# Patient Record
Sex: Female | Born: 1955 | Race: White | Hispanic: No | Marital: Single | Smoking: Former smoker
Health system: Southern US, Community
[De-identification: ages and names within clinical notes are randomized; demographics above are authoritative.]

## PROBLEM LIST (undated history)

## (undated) DIAGNOSIS — G894 Chronic pain syndrome: Secondary | ICD-10-CM

## (undated) DIAGNOSIS — M1990 Unspecified osteoarthritis, unspecified site: Secondary | ICD-10-CM

## (undated) DIAGNOSIS — E876 Hypokalemia: Secondary | ICD-10-CM

## (undated) DIAGNOSIS — I10 Essential (primary) hypertension: Secondary | ICD-10-CM

## (undated) DIAGNOSIS — N3289 Other specified disorders of bladder: Secondary | ICD-10-CM

## (undated) DIAGNOSIS — E039 Hypothyroidism, unspecified: Secondary | ICD-10-CM

## (undated) DIAGNOSIS — I214 Non-ST elevation (NSTEMI) myocardial infarction: Secondary | ICD-10-CM

## (undated) DIAGNOSIS — K529 Noninfective gastroenteritis and colitis, unspecified: Secondary | ICD-10-CM

## (undated) DIAGNOSIS — K589 Irritable bowel syndrome without diarrhea: Secondary | ICD-10-CM

## (undated) DIAGNOSIS — I472 Ventricular tachycardia: Secondary | ICD-10-CM

---

## 2001-03-02 ENCOUNTER — Encounter (HOSPITAL_COMMUNITY): Admission: RE | Admit: 2001-03-02 | Discharge: 2001-04-01 | Payer: Self-pay | Admitting: Rheumatology

## 2001-04-27 ENCOUNTER — Encounter (HOSPITAL_COMMUNITY): Admission: RE | Admit: 2001-04-27 | Discharge: 2001-05-27 | Payer: Self-pay | Admitting: Rheumatology

## 2001-07-20 ENCOUNTER — Encounter (HOSPITAL_COMMUNITY): Admission: RE | Admit: 2001-07-20 | Discharge: 2001-08-19 | Payer: Self-pay | Admitting: Rheumatology

## 2001-09-07 ENCOUNTER — Encounter (HOSPITAL_COMMUNITY): Admission: RE | Admit: 2001-09-07 | Discharge: 2001-10-07 | Payer: Self-pay | Admitting: Rheumatology

## 2001-11-30 ENCOUNTER — Encounter (HOSPITAL_COMMUNITY): Admission: RE | Admit: 2001-11-30 | Discharge: 2001-12-30 | Payer: Self-pay | Admitting: Rheumatology

## 2002-03-01 ENCOUNTER — Encounter (HOSPITAL_COMMUNITY): Admission: RE | Admit: 2002-03-01 | Discharge: 2002-03-31 | Payer: Self-pay | Admitting: Rheumatology

## 2002-06-21 ENCOUNTER — Encounter (HOSPITAL_COMMUNITY): Admission: RE | Admit: 2002-06-21 | Discharge: 2002-07-21 | Payer: Self-pay | Admitting: Rheumatology

## 2002-11-22 ENCOUNTER — Encounter (HOSPITAL_COMMUNITY): Admission: RE | Admit: 2002-11-22 | Discharge: 2002-12-22 | Payer: Self-pay | Admitting: Rheumatology

## 2003-05-23 ENCOUNTER — Encounter (HOSPITAL_COMMUNITY): Admission: RE | Admit: 2003-05-23 | Discharge: 2003-06-06 | Payer: Self-pay | Admitting: Rheumatology

## 2005-06-07 ENCOUNTER — Ambulatory Visit: Payer: Self-pay | Admitting: Internal Medicine

## 2005-07-05 ENCOUNTER — Ambulatory Visit: Payer: Self-pay | Admitting: Internal Medicine

## 2005-07-06 ENCOUNTER — Ambulatory Visit (HOSPITAL_COMMUNITY): Admission: RE | Admit: 2005-07-06 | Discharge: 2005-07-06 | Payer: Self-pay | Admitting: Internal Medicine

## 2006-01-13 ENCOUNTER — Ambulatory Visit: Payer: Self-pay | Admitting: Internal Medicine

## 2012-10-07 ENCOUNTER — Other Ambulatory Visit: Payer: Self-pay | Admitting: Endocrinology

## 2012-10-21 ENCOUNTER — Emergency Department (HOSPITAL_COMMUNITY)
Admission: EM | Admit: 2012-10-21 | Discharge: 2012-10-21 | Disposition: A | Discharge status: Home / Self Care | Payer: Self-pay | Attending: Emergency Medicine | Admitting: Emergency Medicine

## 2012-10-21 ENCOUNTER — Encounter (HOSPITAL_COMMUNITY): Payer: Self-pay | Admitting: *Deleted

## 2012-10-21 DIAGNOSIS — R0602 Shortness of breath: Secondary | ICD-10-CM | POA: Insufficient documentation

## 2012-10-21 DIAGNOSIS — Z538 Procedure and treatment not carried out for other reasons: Secondary | ICD-10-CM | POA: Insufficient documentation

## 2012-10-21 DIAGNOSIS — Z09 Encounter for follow-up examination after completed treatment for conditions other than malignant neoplasm: Secondary | ICD-10-CM | POA: Insufficient documentation

## 2012-10-21 DIAGNOSIS — Z87891 Personal history of nicotine dependence: Secondary | ICD-10-CM | POA: Insufficient documentation

## 2012-10-21 HISTORY — DX: Unspecified osteoarthritis, unspecified site: M19.90

## 2012-10-21 HISTORY — DX: Essential (primary) hypertension: I10

## 2012-10-21 NOTE — ED Notes (Signed)
Spoke with Dr. Leslie Dales about injection that pt should receive. Informed that pt was suppose to receive at Short Stay, informed pt to call Short Stay and set up schedule to receive injections per MD.

## 2012-10-21 NOTE — ED Notes (Addendum)
Pt reports being sent here by dr altheimer for a thyroid injection. We have paged him for further orders. pts only complaint is mild sob. spo2 90% at triage, resp unlabored and no distress noted.

## 2012-10-21 NOTE — ED Notes (Signed)
After speaking with md, pt is go to to short stay on Monday for thyroid meds, pt informed and decided to leave ER despite spo2 90%.

## 2012-10-24 ENCOUNTER — Encounter (HOSPITAL_COMMUNITY)
Admission: RE | Admit: 2012-10-24 | Discharge: 2012-10-24 | Disposition: A | Discharge status: Home / Self Care | Payer: Medicare Other | Source: Ambulatory Visit | Attending: Endocrinology | Admitting: Endocrinology

## 2012-10-24 DIAGNOSIS — E063 Autoimmune thyroiditis: Secondary | ICD-10-CM | POA: Insufficient documentation

## 2012-10-24 DIAGNOSIS — E039 Hypothyroidism, unspecified: Secondary | ICD-10-CM | POA: Insufficient documentation

## 2012-10-24 MED ORDER — LEVOTHYROXINE SODIUM 100 MCG IV SOLR
500.0000 ug | INTRAVENOUS | Status: DC
  Administered 2012-10-24: 500 ug via INTRAVENOUS
  Filled 2012-10-24: qty 25

## 2012-10-24 MED ORDER — SODIUM CHLORIDE 0.9 % IV SOLN
Freq: Once | INTRAVENOUS | Status: AC
  Administered 2012-10-24: 11:00:00 via INTRAVENOUS

## 2012-10-24 NOTE — Progress Notes (Signed)
[name], RPh to call Dr.Altheimer regarding appropriate dose of Levothyroxine. Patient's brother voices concerns about patient behavior recently. States that he is going to go across the street to speak with Dr. Leslie Dales.

## 2012-10-26 ENCOUNTER — Other Ambulatory Visit (HOSPITAL_COMMUNITY): Payer: Self-pay | Admitting: *Deleted

## 2012-10-27 ENCOUNTER — Encounter (HOSPITAL_COMMUNITY)
Admission: RE | Admit: 2012-10-27 | Discharge: 2012-10-27 | Disposition: A | Discharge status: Home / Self Care | Payer: Medicare Other | Source: Ambulatory Visit | Attending: Endocrinology | Admitting: Endocrinology

## 2012-10-27 MED ORDER — SODIUM CHLORIDE 0.9 % IV SOLN: Freq: Once | INTRAVENOUS | Status: DC

## 2012-10-27 MED ORDER — LEVOTHYROXINE SODIUM 100 MCG IV SOLR
500.0000 ug | INTRAVENOUS | Status: DC
  Administered 2012-10-27: 500 ug via INTRAVENOUS
  Filled 2012-10-27: qty 25

## 2012-10-31 ENCOUNTER — Encounter (HOSPITAL_COMMUNITY): Payer: Self-pay

## 2012-11-03 ENCOUNTER — Encounter (HOSPITAL_COMMUNITY)
Admission: RE | Admit: 2012-11-03 | Discharge: 2012-11-03 | Disposition: A | Discharge status: Home / Self Care | Payer: Medicare Other | Source: Ambulatory Visit | Attending: Endocrinology | Admitting: Endocrinology

## 2012-11-03 MED ORDER — SODIUM CHLORIDE 0.9 % IV SOLN
Freq: Once | INTRAVENOUS | Status: AC
  Administered 2012-11-03: 250 mL via INTRAVENOUS

## 2012-11-03 MED ORDER — LEVOTHYROXINE SODIUM 100 MCG IV SOLR
500.0000 ug | INTRAVENOUS | Status: DC
  Administered 2012-11-03: 500 ug via INTRAVENOUS
  Filled 2012-11-03: qty 25

## 2012-11-07 ENCOUNTER — Encounter (HOSPITAL_COMMUNITY): Payer: Self-pay

## 2012-11-14 ENCOUNTER — Emergency Department (HOSPITAL_COMMUNITY): Payer: Medicare Other

## 2012-11-14 ENCOUNTER — Inpatient Hospital Stay (HOSPITAL_COMMUNITY)
Admission: EM | Admit: 2012-11-14 | Discharge: 2012-11-19 | DRG: 640 | Disposition: A | Discharge status: Home / Self Care | Payer: Medicare Other | Attending: Internal Medicine | Admitting: Internal Medicine

## 2012-11-14 ENCOUNTER — Encounter (HOSPITAL_COMMUNITY): Payer: Self-pay | Admitting: *Deleted

## 2012-11-14 DIAGNOSIS — E46 Unspecified protein-calorie malnutrition: Secondary | ICD-10-CM | POA: Diagnosis present

## 2012-11-14 DIAGNOSIS — R3129 Other microscopic hematuria: Secondary | ICD-10-CM | POA: Diagnosis present

## 2012-11-14 DIAGNOSIS — R001 Bradycardia, unspecified: Secondary | ICD-10-CM | POA: Diagnosis not present

## 2012-11-14 DIAGNOSIS — I4729 Other ventricular tachycardia: Secondary | ICD-10-CM | POA: Diagnosis present

## 2012-11-14 DIAGNOSIS — R739 Hyperglycemia, unspecified: Secondary | ICD-10-CM | POA: Diagnosis present

## 2012-11-14 DIAGNOSIS — E876 Hypokalemia: Principal | ICD-10-CM | POA: Diagnosis present

## 2012-11-14 DIAGNOSIS — I214 Non-ST elevation (NSTEMI) myocardial infarction: Secondary | ICD-10-CM | POA: Diagnosis not present

## 2012-11-14 DIAGNOSIS — K529 Noninfective gastroenteritis and colitis, unspecified: Secondary | ICD-10-CM | POA: Diagnosis present

## 2012-11-14 DIAGNOSIS — N3289 Other specified disorders of bladder: Secondary | ICD-10-CM | POA: Diagnosis present

## 2012-11-14 DIAGNOSIS — E063 Autoimmune thyroiditis: Secondary | ICD-10-CM | POA: Diagnosis present

## 2012-11-14 DIAGNOSIS — E039 Hypothyroidism, unspecified: Secondary | ICD-10-CM | POA: Diagnosis present

## 2012-11-14 DIAGNOSIS — B957 Other staphylococcus as the cause of diseases classified elsewhere: Secondary | ICD-10-CM | POA: Diagnosis present

## 2012-11-14 DIAGNOSIS — Z87891 Personal history of nicotine dependence: Secondary | ICD-10-CM

## 2012-11-14 DIAGNOSIS — R7309 Other abnormal glucose: Secondary | ICD-10-CM | POA: Diagnosis present

## 2012-11-14 DIAGNOSIS — E860 Dehydration: Secondary | ICD-10-CM | POA: Diagnosis present

## 2012-11-14 DIAGNOSIS — M129 Arthropathy, unspecified: Secondary | ICD-10-CM | POA: Diagnosis present

## 2012-11-14 DIAGNOSIS — N390 Urinary tract infection, site not specified: Secondary | ICD-10-CM | POA: Diagnosis present

## 2012-11-14 DIAGNOSIS — D649 Anemia, unspecified: Secondary | ICD-10-CM | POA: Diagnosis not present

## 2012-11-14 DIAGNOSIS — G894 Chronic pain syndrome: Secondary | ICD-10-CM | POA: Diagnosis present

## 2012-11-14 DIAGNOSIS — Z79899 Other long term (current) drug therapy: Secondary | ICD-10-CM

## 2012-11-14 DIAGNOSIS — I10 Essential (primary) hypertension: Secondary | ICD-10-CM | POA: Diagnosis present

## 2012-11-14 DIAGNOSIS — Z9181 History of falling: Secondary | ICD-10-CM

## 2012-11-14 DIAGNOSIS — R109 Unspecified abdominal pain: Secondary | ICD-10-CM | POA: Diagnosis present

## 2012-11-14 DIAGNOSIS — G9341 Metabolic encephalopathy: Secondary | ICD-10-CM | POA: Diagnosis present

## 2012-11-14 DIAGNOSIS — K5289 Other specified noninfective gastroenteritis and colitis: Secondary | ICD-10-CM | POA: Diagnosis present

## 2012-11-14 DIAGNOSIS — I472 Ventricular tachycardia, unspecified: Secondary | ICD-10-CM | POA: Diagnosis present

## 2012-11-14 DIAGNOSIS — K589 Irritable bowel syndrome without diarrhea: Secondary | ICD-10-CM | POA: Diagnosis present

## 2012-11-14 HISTORY — DX: Irritable bowel syndrome without diarrhea: K58.9

## 2012-11-14 HISTORY — DX: Noninfective gastroenteritis and colitis, unspecified: K52.9

## 2012-11-14 HISTORY — DX: Hypothyroidism, unspecified: E03.9

## 2012-11-14 HISTORY — DX: Ventricular tachycardia: I47.2

## 2012-11-14 HISTORY — DX: Other specified disorders of bladder: N32.89

## 2012-11-14 HISTORY — DX: Hypokalemia: E87.6

## 2012-11-14 HISTORY — DX: Non-ST elevation (NSTEMI) myocardial infarction: I21.4

## 2012-11-14 HISTORY — DX: Chronic pain syndrome: G89.4

## 2012-11-14 LAB — URINALYSIS, ROUTINE W REFLEX MICROSCOPIC
Glucose, UA: NEGATIVE mg/dL
Ketones, ur: 15 mg/dL — AB
Leukocytes, UA: NEGATIVE
Nitrite: NEGATIVE
Protein, ur: 100 mg/dL — AB
Specific Gravity, Urine: 1.025 (ref 1.005–1.030)
Urobilinogen, UA: 0.2 mg/dL (ref 0.0–1.0)
pH: 6 (ref 5.0–8.0)

## 2012-11-14 LAB — COMPREHENSIVE METABOLIC PANEL
ALT: 20 U/L (ref 0–35)
AST: 36 U/L (ref 0–37)
Albumin: 2.6 g/dL — ABNORMAL LOW (ref 3.5–5.2)
Alkaline Phosphatase: 67 U/L (ref 39–117)
BUN: 6 mg/dL (ref 6–23)
CO2: 31 mEq/L (ref 19–32)
Calcium: 8.1 mg/dL — ABNORMAL LOW (ref 8.4–10.5)
Chloride: 93 mEq/L — ABNORMAL LOW (ref 96–112)
Creatinine, Ser: 0.81 mg/dL (ref 0.50–1.10)
GFR calc Af Amer: >90 mL/min (ref >90)
GFR calc non Af Amer: 80 mL/min — ABNORMAL LOW (ref >90)
Glucose, Bld: 171 mg/dL — ABNORMAL HIGH (ref 70–99)
Potassium: <2 mEq/L — CL (ref 3.5–5.1)
Sodium: 136 mEq/L (ref 135–145)
Total Bilirubin: 0.4 mg/dL (ref 0.3–1.2)
Total Protein: 6.4 g/dL (ref 6.0–8.3)

## 2012-11-14 LAB — URINE MICROSCOPIC-ADD ON

## 2012-11-14 LAB — CBC WITH DIFFERENTIAL/PLATELET
Basophils Absolute: 0 10*3/uL (ref 0.0–0.1)
Basophils Relative: 0 % (ref 0–1)
Eosinophils Absolute: 0 10*3/uL (ref 0.0–0.7)
Eosinophils Relative: 0 % (ref 0–5)
HCT: 37.2 % (ref 36.0–46.0)
Hemoglobin: 12.8 g/dL (ref 12.0–15.0)
Lymphocytes Relative: 16 % (ref 12–46)
Lymphs Abs: 0.8 10*3/uL (ref 0.7–4.0)
MCH: 31.5 pg (ref 26.0–34.0)
MCHC: 34.4 g/dL (ref 30.0–36.0)
MCV: 91.6 fL (ref 78.0–100.0)
Monocytes Absolute: 0.1 10*3/uL (ref 0.1–1.0)
Monocytes Relative: 3 % (ref 3–12)
Neutro Abs: 4.1 10*3/uL (ref 1.7–7.7)
Neutrophils Relative %: 81 % — ABNORMAL HIGH (ref 43–77)
Platelets: 323 10*3/uL (ref 150–400)
RBC: 4.06 MIL/uL (ref 3.87–5.11)
RDW: 14.5 % (ref 11.5–15.5)
WBC: 5 10*3/uL (ref 4.0–10.5)

## 2012-11-14 LAB — LIPASE, BLOOD: Lipase: 26 U/L (ref 11–59)

## 2012-11-14 LAB — TROPONIN I: Troponin I: <0.3 ng/mL (ref <0.30)

## 2012-11-14 LAB — MAGNESIUM: Magnesium: 2 mg/dL (ref 1.5–2.5)

## 2012-11-14 LAB — LACTIC ACID, PLASMA: Lactic Acid, Venous: 1 mmol/L (ref 0.5–2.2)

## 2012-11-14 MED ORDER — POTASSIUM CHLORIDE 10 MEQ/100ML IV SOLN
10.0000 meq | Freq: Once | INTRAVENOUS | Status: AC
  Administered 2012-11-14: 10 meq via INTRAVENOUS
  Filled 2012-11-14: qty 100

## 2012-11-14 MED ORDER — SODIUM CHLORIDE 0.9 % IV SOLN
Freq: Once | INTRAVENOUS | Status: AC
  Administered 2012-11-14: 20 mL/h via INTRAVENOUS

## 2012-11-14 MED ORDER — ONDANSETRON HCL 4 MG/2ML IJ SOLN
4.0000 mg | Freq: Once | INTRAMUSCULAR | Status: AC
  Administered 2012-11-14: 4 mg via INTRAVENOUS
  Filled 2012-11-14: qty 2

## 2012-11-14 MED ORDER — DEXTROSE 5 % IV SOLN
1.0000 g | Freq: Once | INTRAVENOUS | Status: AC
  Administered 2012-11-14: 1 g via INTRAVENOUS
  Filled 2012-11-14 (×2): qty 10

## 2012-11-14 MED ORDER — IOHEXOL 350 MG/ML SOLN
100.0000 mL | Freq: Once | INTRAVENOUS | Status: AC | PRN
  Administered 2012-11-14: 100 mL via INTRAVENOUS

## 2012-11-14 MED ORDER — MORPHINE SULFATE 4 MG/ML IJ SOLN
4.0000 mg | Freq: Once | INTRAMUSCULAR | Status: AC
  Administered 2012-11-14: 4 mg via INTRAVENOUS
  Filled 2012-11-14: qty 1

## 2012-11-14 MED ORDER — LEVOTHYROXINE SODIUM 100 MCG IV SOLR
INTRAVENOUS | Status: AC
  Filled 2012-11-14: qty 5

## 2012-11-14 MED ORDER — LEVOTHYROXINE SODIUM 100 MCG IV SOLR
100.0000 ug | Freq: Every day | INTRAVENOUS | Status: DC
  Administered 2012-11-14: 100 ug via INTRAVENOUS
  Filled 2012-11-14 (×3): qty 5

## 2012-11-14 MED ORDER — POTASSIUM CHLORIDE 10 MEQ/100ML IV SOLN
10.0000 meq | Freq: Once | INTRAVENOUS | Status: AC
  Administered 2012-11-15: 10 meq via INTRAVENOUS
  Filled 2012-11-14: qty 100

## 2012-11-14 NOTE — ED Notes (Signed)
Awaiting arrival of family for more detailed history.

## 2012-11-14 NOTE — ED Provider Notes (Signed)
History    This chart was scribed for Dione Booze, MD by Gerlean Ren, ED Scribe. This patient was seen in room [facility]14/[facility]14 and the patient's care was started at 9:24 PM    CSN: [id number]  Arrival date & time 11/14/12  2059   First MD Initiated Contact with Patient 11/14/12 2116      Chief Complaint  Patient presents with  . Abdominal Pain  . Altered Mental Status   Level 5 Caveat- Mental Status Change The history is provided by a relative. No language interpreter was used.  [name] is a 57 y.o. female with h/o HTN and thyroid disease brought in by ambulance to the Emergency Department after the neighbors found the pt more disoriented than baseline with severe abdominal pain, nausea, and emesis.  Pt currently complains of left-side abdominal pain.  Pt states she has not had food or drink today.  Pt states she has been out of her thyroid medication. 9:31 PM- Initiated contact with brother, he states he has not seen pt in one week and was told above story from neighbors.  Brother reports he is unsure of the drugs that pt has been taking. PCP is Dr. Leslie Dales. Past Medical History  Diagnosis Date  . Arthritis   . Thyroid disease   . Hypertension     History reviewed. No pertinent past surgical history.  History reviewed. No pertinent family history.  History  Substance Use Topics  . Smoking status: Former Games developer  . Smokeless tobacco: Not on file  . Alcohol Use: No    No OB history provided.   Review of Systems  Unable to perform ROS: Mental status change    Allergies  Calcium-containing compounds; Codeine; and Tagamet  Home Medications  No current outpatient prescriptions on file.  BP 142/84  Pulse 67  Temp(Src) 97.6 F (36.4 C) (Oral)  Resp 16  Ht 5\' 2"  (1.575 m)  Wt 115 lb (52.164 kg)  BMI 21.03 kg/m2  SpO2 98%  Physical Exam  Nursing note and vitals reviewed. Constitutional: She appears well-developed and well-nourished. No distress.  Lethargic but  arousable, when aroused oriented to person, place, time.  HENT:  Head: Normocephalic and atraumatic.  Eyes: EOM are normal.  Neck: Neck supple. No tracheal deviation present.  Cardiovascular: Normal rate, regular rhythm and normal heart sounds.   Pulmonary/Chest: Effort normal and breath sounds normal. No respiratory distress. She has no wheezes. She has no rales.  Abdominal: Soft. There is tenderness.  Severe tenderness diffusely, worse on left side, bowel sounds decreased.  Genitourinary:  Normal sphincter tone, no masses, stool normal color.  Musculoskeletal: Normal range of motion.  Neurological:  No focal motor or sensory deficits.  Lethargic but arousable, when aroused oriented to person, place, time.  Skin: Skin is warm and dry.  Psychiatric:  Lethargic but arousable.    ED Course  Procedures (including critical care time) DIAGNOSTIC STUDIES: Oxygen Saturation is 98% on room air, normal by my interpretation.    COORDINATION OF CARE: 9:37 PM- Informed brother of clinical course including blood work, chest XR, and abdominal/pelvic CT.  Brother understands and agrees to plan.    Results for orders placed during the hospital encounter of 11/14/12  CBC WITH DIFFERENTIAL      Result Value Range   WBC 5.0  4.0 - 10.5 K/uL   RBC 4.06  3.87 - 5.11 MIL/uL   Hemoglobin 12.8  12.0 - 15.0 g/dL   HCT 40.9  81.1 - 91.4 %  MCV 91.6  78.0 - 100.0 fL   MCH 31.5  26.0 - 34.0 pg   MCHC 34.4  30.0 - 36.0 g/dL   RDW 29.5  62.1 - 30.8 %   Platelets 323  150 - 400 K/uL   Neutrophils Relative 81 (*) 43 - 77 %   Neutro Abs 4.1  1.7 - 7.7 K/uL   Lymphocytes Relative 16  12 - 46 %   Lymphs Abs 0.8  0.7 - 4.0 K/uL   Monocytes Relative 3  3 - 12 %   Monocytes Absolute 0.1  0.1 - 1.0 K/uL   Eosinophils Relative 0  0 - 5 %   Eosinophils Absolute 0.0  0.0 - 0.7 K/uL   Basophils Relative 0  0 - 1 %   Basophils Absolute 0.0  0.0 - 0.1 K/uL  COMPREHENSIVE METABOLIC PANEL      Result Value Range    Sodium 136  135 - 145 mEq/L   Potassium <2.0 (*) 3.5 - 5.1 mEq/L   Chloride 93 (*) 96 - 112 mEq/L   CO2 31  19 - 32 mEq/L   Glucose, Bld 171 (*) 70 - 99 mg/dL   BUN 6  6 - 23 mg/dL   Creatinine, Ser 6.57  0.50 - 1.10 mg/dL   Calcium 8.1 (*) 8.4 - 10.5 mg/dL   Total Protein 6.4  6.0 - 8.3 g/dL   Albumin 2.6 (*) 3.5 - 5.2 g/dL   AST 36  0 - 37 U/L   ALT 20  0 - 35 U/L   Alkaline Phosphatase 67  39 - 117 U/L   Total Bilirubin 0.4  0.3 - 1.2 mg/dL   GFR calc non Af Amer 80 (*) >90 mL/min   GFR calc Af Amer >90  >90 mL/min  LIPASE, BLOOD      Result Value Range   Lipase 26  11 - 59 U/L  LACTIC ACID, PLASMA      Result Value Range   Lactic Acid, Venous 1.0  0.5 - 2.2 mmol/L  TROPONIN I      Result Value Range   Troponin I <0.30  <0.30 ng/mL  MAGNESIUM      Result Value Range   Magnesium 2.0  1.5 - 2.5 mg/dL  URINALYSIS, ROUTINE W REFLEX MICROSCOPIC      Result Value Range   Color, Urine YELLOW  YELLOW   APPearance CLOUDY (*) CLEAR   Specific Gravity, Urine 1.025  1.005 - 1.030   pH 6.0  5.0 - 8.0   Glucose, UA NEGATIVE  NEGATIVE mg/dL   Hgb urine dipstick LARGE (*) NEGATIVE   Bilirubin Urine MODERATE (*) NEGATIVE   Ketones, ur 15 (*) NEGATIVE mg/dL   Protein, ur 846 (*) NEGATIVE mg/dL   Urobilinogen, UA 0.2  0.0 - 1.0 mg/dL   Nitrite NEGATIVE  NEGATIVE   Leukocytes, UA NEGATIVE  NEGATIVE  URINE MICROSCOPIC-ADD ON      Result Value Range   Squamous Epithelial / LPF FEW (*) RARE   WBC, UA 7-10  <3 WBC/hpf   RBC / HPF TOO NUMEROUS TO COUNT  <3 RBC/hpf   Bacteria, UA MANY (*) RARE   Casts HYALINE CASTS (*) NEGATIVE  APTT      Result Value Range   aPTT 28  24 - 37 seconds  PROTIME-INR      Result Value Range   Prothrombin Time 13.0  11.6 - 15.2 seconds   INR 0.99  0.00 - 1.49  Dg Chest Portable 1 View  11/14/2012  *RADIOLOGY REPORT*  Clinical Data: Abdominal pain, altered mental status, history hypertension, former smoker  PORTABLE CHEST - 1 VIEW  Comparison:  Portable exam 2145 hours compared to 04/17/2012  Findings: Lordotic positioning. Right subclavian Port-A-Cath with tip projecting over proximal SVC. Normal heart size, mediastinal contours, and pulmonary vascularity. Emphysematous and minimal bronchitic changes question COPD. No acute infiltrate, pleural effusion or pneumothorax. Probable right nipple shadow. No pleural effusion or pneumothorax.  IMPRESSION: Question COPD. No acute abnormalities.   Original Report Authenticated By: Ulyses Southward, M.D.    Ct Cta Abd/pel W/cm &/or W/o Cm  11/15/2012  *RADIOLOGY REPORT*  Clinical Data: Left-sided abdominal pain  CT ANGIOGRAPHY ABDOMEN AND PELVIS WITH CONTRAST AND WITHOUT CONTRAST  Contrast:  100 ml Omnipaque three fifty  Comparison: None.  Findings: Lung bases are predominately clear with mild areas of scarring along the periphery posteriorly.  Heart size within normal limits.  There is scattered atherosclerotic disease of the aorta and branch vessels including advanced atherosclerotic plaque of the aorta at the level of the IMA origin which results in luminal narrowing of 50%.  Poststenotic ectasia at 1.8 cm.  The aorta measures 1.5 cm above the level of the stenosis.  The celiac axis, SMA, single renal arteries, and IMA remain patent. There is advanced atherosclerotic disease of the bilateral common iliac arteries without aneurysmal dilatation.  Iliac branch vessels remain patent.  Organ evaluation is limited in the arterial and delayed phases. Allowing for this, low attenuation of the liver suggests fatty infiltration.  Unremarkable spleen, pancreas, adrenal glands.  Absent gallbladder.  Mild biliary ductal dilatation is nonspecific.  Symmetric renal enhancement.  No hydronephrosis or hydroureter.  No CT evidence for colitis.  Mild colonic diverticulosis.  There are several thickened small bowel loops with mucosal hyperenhancement (see series 4 image 118 as index).  There is mesenteric fat stranding and  intraperitoneal fluid within the abdomen pelvis.  No free intraperitoneal air.  No lymphadenopathy.  Bladder wall thickening along the anterior and lateral margins, somewhat nodular on the right as seen on coronal image 69. Relative sparing posteriorly.  No acute osseous finding.  IMPRESSION: Advanced atherosclerotic disease of the aorta as above. Branch vessels remain patent.  Thickened loops of small bowel with mucosal hyperenhancement.  This is in keeping with a nonspecific enteritis (infectious, ischemic, and inflammatory considerations).  There is free intraperitoneal fluid which may be reactive.  Hepatic steatosis.  Enhancing bladder wall, somewhat nodular on the right.  Recommend urinalysis and cystoscopy correlation.   Original Report Authenticated By: Jearld Lesch, M.D.     Images viewed by me.   Date: 11/14/2012  Rate: 74  Rhythm: normal sinus rhythm  QRS Axis: left  Intervals: normal and QT prolonged  ST/T Wave abnormalities: ST depression in inferior, anterior, and anterolateral leads worrisome for ischemia  Conduction Disutrbances:none  Narrative Interpretation: Prolonged QT interval, ST depression worrisome for ischemia, no prior ECG available for comparison.  Old EKG Reviewed: none available    1. Abdominal pain   2. Colitis   3. Hypokalemia   4. Thyroiditis, chronic, lymphocytic   5. Enteritis   6. Encephalopathy, metabolic    CRITICAL CARE Performed by: XBJYN,WGNFA   Total critical care time: 45 minutes  Critical care time was exclusive of separately billable procedures and treating other patients.  Critical care was necessary to treat or prevent imminent or life-threatening deterioration.  Critical care was time spent personally by me on  the following activities: development of treatment plan with patient and/or surrogate as well as nursing, discussions with consultants, evaluation of patient's response to treatment, examination of patient, obtaining history  from patient or surrogate, ordering and performing treatments and interventions, ordering and review of laboratory studies, ordering and review of radiographic studies, pulse oximetry and re-evaluation of patient's condition.   MDM  Severe abdominal pain and tenderness of uncertain cause. Altered mentation of uncertain cause. Abnormal ECG worrisome for ischemia with no prior ECG available for comparison. Because of abdominal tenderness, I cannot do a good, deep palpation to evaluate for possible aneurysm and she has no prior imaging to see if she had any known aneurysm. Therefore, she will be sent for CT angiogram to evaluate possible aneurysm and possible other intra-abdominal pathology. She'll be given IV fluids and troponin level be checked as well as lactic acid.  Laboratory workup is significant for severe hypokalemia with potassium less than 2.0 so she was given intravenous potassium. WBC, lactic acid level, renal function and hepatic function are all normal. CT shows evidence of colitis and urinalysis is positive for UTI. She was initially given Rocephin for the positive urinalysis. With CT showing colitis, and she was given ciprofloxacin and metronidazole. She seemed more comfortable after IV fluids and IV morphine. Of note, CT scan did show significant atherosclerotic narrowing of the aorta but no actual vascular compromise. Case is discussed with Dr. Orvan Falconer of [facility] who agrees to admit the patient. Because of severity of illness, she was admitted to the step down bed.  I personally performed the services described in this documentation, which was scribed in my presence. The recorded information has been reviewed and is accurate.          Dione Booze, MD 11/15/12 [phone]

## 2012-11-14 NOTE — ED Notes (Signed)
Speech slow and coherent

## 2012-11-14 NOTE — ED Notes (Addendum)
Pt to department via EMS.  Per report, pt was found by family to be more disoriented than normal and having severe abdominal pain, nausea and vomiting.  Blood sugar checked by EMS, results 372.  Per report, pt out of medications due to inability to afford them.  Unable to obtain much history from pt.  Family to come to department.

## 2012-11-14 NOTE — ED Notes (Signed)
CRITICAL VALUE ALERT  Critical value received:  K+  <2.0  Date of notification:  11/14/2012  Time of notification:  2227  Critical value read back: Yes  Nurse who received alert: Tiana Loft, RN  MD notified:  Dr. Preston Fleeting notified at 2230

## 2012-11-15 ENCOUNTER — Encounter (HOSPITAL_COMMUNITY): Payer: Self-pay | Admitting: Internal Medicine

## 2012-11-15 DIAGNOSIS — E860 Dehydration: Secondary | ICD-10-CM | POA: Diagnosis present

## 2012-11-15 DIAGNOSIS — R109 Unspecified abdominal pain: Secondary | ICD-10-CM

## 2012-11-15 DIAGNOSIS — G9341 Metabolic encephalopathy: Secondary | ICD-10-CM | POA: Diagnosis present

## 2012-11-15 DIAGNOSIS — K529 Noninfective gastroenteritis and colitis, unspecified: Secondary | ICD-10-CM

## 2012-11-15 DIAGNOSIS — G894 Chronic pain syndrome: Secondary | ICD-10-CM | POA: Diagnosis present

## 2012-11-15 DIAGNOSIS — I472 Ventricular tachycardia: Secondary | ICD-10-CM

## 2012-11-15 DIAGNOSIS — R3129 Other microscopic hematuria: Secondary | ICD-10-CM | POA: Diagnosis present

## 2012-11-15 DIAGNOSIS — E876 Hypokalemia: Secondary | ICD-10-CM

## 2012-11-15 DIAGNOSIS — E063 Autoimmune thyroiditis: Secondary | ICD-10-CM

## 2012-11-15 DIAGNOSIS — N3289 Other specified disorders of bladder: Secondary | ICD-10-CM

## 2012-11-15 HISTORY — DX: Hypokalemia: E87.6

## 2012-11-15 HISTORY — DX: Noninfective gastroenteritis and colitis, unspecified: K52.9

## 2012-11-15 HISTORY — DX: Other specified disorders of bladder: N32.89

## 2012-11-15 LAB — BASIC METABOLIC PANEL
BUN: 6 mg/dL (ref 6–23)
BUN: 5 mg/dL — ABNORMAL LOW (ref 6–23)
CO2: 31 mEq/L (ref 19–32)
CO2: 30 mEq/L (ref 19–32)
Calcium: 7.9 mg/dL — ABNORMAL LOW (ref 8.4–10.5)
Chloride: 100 mEq/L (ref 96–112)
Creatinine, Ser: 0.81 mg/dL (ref 0.50–1.10)
Creatinine, Ser: 0.73 mg/dL (ref 0.50–1.10)
GFR calc Af Amer: >90 mL/min (ref >90)
Glucose, Bld: 136 mg/dL — ABNORMAL HIGH (ref 70–99)
Potassium: 2.4 mEq/L — CL (ref 3.5–5.1)

## 2012-11-15 LAB — MRSA PCR SCREENING: MRSA by PCR: NEGATIVE

## 2012-11-15 LAB — COMPREHENSIVE METABOLIC PANEL
ALT: 17 U/L (ref 0–35)
AST: 35 U/L (ref 0–37)
Albumin: 2.6 g/dL — ABNORMAL LOW (ref 3.5–5.2)
Alkaline Phosphatase: 64 U/L (ref 39–117)
Chloride: 102 mEq/L (ref 96–112)
Potassium: 2.7 mEq/L — CL (ref 3.5–5.1)
Sodium: 138 mEq/L (ref 135–145)
Total Bilirubin: 0.4 mg/dL (ref 0.3–1.2)
Total Protein: 6.1 g/dL (ref 6.0–8.3)

## 2012-11-15 LAB — MAGNESIUM
Magnesium: 1.9 mg/dL (ref 1.5–2.5)
Magnesium: 2.1 mg/dL (ref 1.5–2.5)

## 2012-11-15 LAB — CBC
MCH: 31.8 pg (ref 26.0–34.0)
MCHC: 34 g/dL (ref 30.0–36.0)
Platelets: 330 10*3/uL (ref 150–400)
RBC: 4.12 MIL/uL (ref 3.87–5.11)

## 2012-11-15 LAB — PROTIME-INR
INR: 0.99 (ref 0.00–1.49)
Prothrombin Time: 13 seconds (ref 11.6–15.2)

## 2012-11-15 LAB — APTT: aPTT: 28 seconds (ref 24–37)

## 2012-11-15 MED ORDER — POTASSIUM CHLORIDE CRYS ER 20 MEQ PO TBCR
20.0000 meq | EXTENDED_RELEASE_TABLET | Freq: Three times a day (TID) | ORAL | Status: AC
  Administered 2012-11-15 (×2): 20 meq via ORAL
  Filled 2012-11-15 (×3): qty 1

## 2012-11-15 MED ORDER — MAGNESIUM SULFATE 50 % IJ SOLN
1.0000 g | Freq: Once | INTRAVENOUS | Status: DC
  Filled 2012-11-15: qty 2

## 2012-11-15 MED ORDER — ENOXAPARIN SODIUM 40 MG/0.4ML ~~LOC~~ SOLN
40.0000 mg | SUBCUTANEOUS | Status: DC
  Administered 2012-11-15: 40 mg via SUBCUTANEOUS
  Filled 2012-11-15 (×2): qty 0.4

## 2012-11-15 MED ORDER — METRONIDAZOLE IN NACL 5-0.79 MG/ML-% IV SOLN
500.0000 mg | Freq: Once | INTRAVENOUS | Status: AC
  Administered 2012-11-15: 500 mg via INTRAVENOUS
  Filled 2012-11-15: qty 100

## 2012-11-15 MED ORDER — FENTANYL 100 MCG/HR TD PT72
100.0000 ug | MEDICATED_PATCH | TRANSDERMAL | Status: DC
  Administered 2012-11-15: 100 ug via TRANSDERMAL
  Filled 2012-11-15: qty 1

## 2012-11-15 MED ORDER — BIOTENE DRY MOUTH MT LIQD: 15.0000 mL | Freq: Two times a day (BID) | OROMUCOSAL | Status: DC

## 2012-11-15 MED ORDER — SODIUM CHLORIDE 0.9 % IV SOLN
INTRAVENOUS | Status: DC
  Filled 2012-11-15: qty 1000

## 2012-11-15 MED ORDER — ASPIRIN 325 MG PO TABS: 325.0000 mg | ORAL_TABLET | Freq: Once | ORAL | Status: DC

## 2012-11-15 MED ORDER — SPIRONOLACTONE 25 MG PO TABS
25.0000 mg | ORAL_TABLET | Freq: Once | ORAL | Status: AC
  Administered 2012-11-15: 25 mg via ORAL
  Filled 2012-11-15: qty 1

## 2012-11-15 MED ORDER — MAGNESIUM SULFATE IN D5W 10-5 MG/ML-% IV SOLN
1.0000 g | Freq: Once | INTRAVENOUS | Status: AC
  Administered 2012-11-15: 1 g via INTRAVENOUS
  Filled 2012-11-15: qty 100

## 2012-11-15 MED ORDER — MAGNESIUM SULFATE 50 % IJ SOLN: 1.0000 g | Freq: Once | INTRAVENOUS | Status: DC

## 2012-11-15 MED ORDER — POTASSIUM CHLORIDE 10 MEQ/100ML IV SOLN
10.0000 meq | INTRAVENOUS | Status: AC
  Administered 2012-11-16 (×5): 10 meq via INTRAVENOUS
  Filled 2012-11-15: qty 500

## 2012-11-15 MED ORDER — MORPHINE SULFATE 4 MG/ML IJ SOLN: 4.0000 mg | INTRAMUSCULAR | Status: DC | PRN

## 2012-11-15 MED ORDER — PANTOPRAZOLE SODIUM 40 MG IV SOLR
40.0000 mg | INTRAVENOUS | Status: DC
  Administered 2012-11-15 – 2012-11-17 (×3): 40 mg via INTRAVENOUS
  Filled 2012-11-15 (×3): qty 40

## 2012-11-15 MED ORDER — MORPHINE SULFATE 2 MG/ML IJ SOLN
2.0000 mg | INTRAMUSCULAR | Status: DC | PRN
  Administered 2012-11-15 (×3): 2 mg via INTRAVENOUS
  Administered 2012-11-16: 4 mg via INTRAVENOUS
  Administered 2012-11-16 – 2012-11-17 (×6): 2 mg via INTRAVENOUS
  Administered 2012-11-17: 4 mg via INTRAVENOUS
  Administered 2012-11-18 (×4): 2 mg via INTRAVENOUS
  Administered 2012-11-19: 4 mg via INTRAVENOUS
  Filled 2012-11-15 (×8): qty 1
  Filled 2012-11-15: qty 2
  Filled 2012-11-15 (×2): qty 1
  Filled 2012-11-15: qty 2
  Filled 2012-11-15 (×2): qty 1
  Filled 2012-11-15: qty 2
  Filled 2012-11-15: qty 1

## 2012-11-15 MED ORDER — CIPROFLOXACIN IN D5W 400 MG/200ML IV SOLN
400.0000 mg | Freq: Once | INTRAVENOUS | Status: AC
  Administered 2012-11-15: 400 mg via INTRAVENOUS
  Filled 2012-11-15: qty 200

## 2012-11-15 MED ORDER — ACETAMINOPHEN 325 MG PO TABS
650.0000 mg | ORAL_TABLET | ORAL | Status: DC | PRN
  Filled 2012-11-15: qty 2

## 2012-11-15 MED ORDER — TRAZODONE HCL 50 MG PO TABS
50.0000 mg | ORAL_TABLET | Freq: Every evening | ORAL | Status: DC | PRN
  Administered 2012-11-18: 50 mg via ORAL
  Filled 2012-11-15: qty 1

## 2012-11-15 MED ORDER — POTASSIUM CHLORIDE 10 MEQ/100ML IV SOLN
10.0000 meq | INTRAVENOUS | Status: AC
  Administered 2012-11-15 (×3): 10 meq via INTRAVENOUS
  Filled 2012-11-15: qty 300

## 2012-11-15 MED ORDER — CHLORHEXIDINE GLUCONATE 0.12 % MT SOLN: 15.0000 mL | Freq: Two times a day (BID) | OROMUCOSAL | Status: DC

## 2012-11-15 MED ORDER — METRONIDAZOLE IN NACL 5-0.79 MG/ML-% IV SOLN
500.0000 mg | Freq: Three times a day (TID) | INTRAVENOUS | Status: DC
  Administered 2012-11-15 – 2012-11-17 (×8): 500 mg via INTRAVENOUS
  Filled 2012-11-15 (×16): qty 100

## 2012-11-15 MED ORDER — SODIUM CHLORIDE 0.9 % IV SOLN
INTRAVENOUS | Status: AC
  Filled 2012-11-15: qty 1000

## 2012-11-15 MED ORDER — ONDANSETRON HCL 4 MG/2ML IJ SOLN: 4.0000 mg | Freq: Four times a day (QID) | INTRAMUSCULAR | Status: DC | PRN

## 2012-11-15 MED ORDER — POTASSIUM CHLORIDE CRYS ER 20 MEQ PO TBCR
40.0000 meq | EXTENDED_RELEASE_TABLET | ORAL | Status: DC
  Administered 2012-11-15 (×2): 40 meq via ORAL
  Filled 2012-11-15 (×2): qty 2

## 2012-11-15 MED ORDER — PRO-STAT SUGAR FREE PO LIQD
30.0000 mL | Freq: Three times a day (TID) | ORAL | Status: DC
  Administered 2012-11-15 – 2012-11-19 (×5): 30 mL via ORAL
  Filled 2012-11-15 (×6): qty 30

## 2012-11-15 MED ORDER — SODIUM CHLORIDE 0.9 % IV SOLN: INTRAVENOUS | Status: DC

## 2012-11-15 MED ORDER — FENTANYL 100 MCG/HR TD PT72
100.0000 ug | MEDICATED_PATCH | TRANSDERMAL | Status: DC
  Administered 2012-11-17 – 2012-11-19 (×2): 100 ug via TRANSDERMAL
  Filled 2012-11-15 (×3): qty 1

## 2012-11-15 MED ORDER — POTASSIUM CHLORIDE IN NACL 40-0.9 MEQ/L-% IV SOLN
INTRAVENOUS | Status: DC
  Administered 2012-11-15 – 2012-11-18 (×4): via INTRAVENOUS

## 2012-11-15 MED ORDER — CIPROFLOXACIN IN D5W 400 MG/200ML IV SOLN
400.0000 mg | Freq: Two times a day (BID) | INTRAVENOUS | Status: DC
  Administered 2012-11-15 – 2012-11-17 (×5): 400 mg via INTRAVENOUS
  Filled 2012-11-15 (×11): qty 200

## 2012-11-15 MED ORDER — SODIUM CHLORIDE 0.9 % IJ SOLN
3.0000 mL | Freq: Two times a day (BID) | INTRAMUSCULAR | Status: DC
  Administered 2012-11-15 – 2012-11-19 (×5): 3 mL via INTRAVENOUS
  Filled 2012-11-15: qty 3

## 2012-11-15 MED ORDER — ONDANSETRON HCL 4 MG/2ML IJ SOLN
4.0000 mg | INTRAMUSCULAR | Status: DC | PRN
  Administered 2012-11-15 – 2012-11-18 (×3): 4 mg via INTRAVENOUS
  Filled 2012-11-15 (×3): qty 2

## 2012-11-15 NOTE — Progress Notes (Signed)
ANTIBIOTIC CONSULT NOTE - INITIAL  Pharmacy Consult for Cipro Indication: enteritis, h/o Crohn's  Allergies  Allergen Reactions  . Aspirin Rash  . Calcium-Containing Compounds Rash  . Codeine Rash  . Tagamet (Cimetidine) Rash    Patient Measurements: Height: 5\' 2"  (157.5 cm) Weight: 118 lb 6.2 oz (53.7 kg) IBW/kg (Calculated) : 50.1  Vital Signs: Temp: 97.8 F (36.6 C) (03/12 0313) Temp src: Oral (03/12 0313) BP: 134/84 mmHg (03/12 0158) Pulse Rate: 80 (03/12 0158) Intake/Output from previous day: 03/11 0701 - 03/12 0700 In: 1110.2 [P.O.:210; I.V.:300.2; IV Piggyback:600] Out: -  Intake/Output from this shift:    Labs:  Recent Labs  11/14/12 2136  WBC 5.0  HGB 12.8  PLT 323  CREATININE 0.81   Estimated Creatinine Clearance: 62.1 ml/min (by C-G formula based on Cr of 0.81). No results found for this basename: VANCOTROUGH, VANCOPEAK, VANCORANDOM, GENTTROUGH, GENTPEAK, GENTRANDOM, TOBRATROUGH, TOBRAPEAK, TOBRARND, AMIKACINPEAK, AMIKACINTROU, AMIKACIN,  in the last 72 hours   Microbiology: No results found for this or any previous visit (from the past 720 hour(s)).  Medical History: Past Medical History  Diagnosis Date  . Arthritis   . Hypothyroidism   . Hypertension   . Bladder wall thickening 11/15/2012  . Enteritis 11/15/2012  . Hypokalemia 11/15/2012  . Chronic pain syndrome     Medications:  Scheduled:  . [COMPLETED] sodium chloride   Intravenous Once  . [COMPLETED] sodium chloride   Intravenous Once  . [COMPLETED] sodium chloride   Intravenous Once  . [COMPLETED] cefTRIAXone (ROCEPHIN) IVPB 1 gram/50 mL D5W  1 g Intravenous Once  . [COMPLETED] ciprofloxacin  400 mg Intravenous Once  . enoxaparin (LOVENOX) injection  40 mg Subcutaneous Q24H  . [COMPLETED] metroNIDAZOLE  500 mg Intravenous Once  . metronidazole  500 mg Intravenous Q8H  . [COMPLETED] morphine  4 mg Intravenous Once  . [COMPLETED] ondansetron  4 mg Intravenous Once  . pantoprazole  (PROTONIX) IV  40 mg Intravenous Q24H  . [COMPLETED] potassium chloride  10 mEq Intravenous Once  . [COMPLETED] potassium chloride  10 mEq Intravenous Once  . potassium chloride  10 mEq Intravenous Q1 Hr x 3  . potassium chloride  40 mEq Oral Q4H  . sodium chloride  3 mL Intravenous Q12H  . [DISCONTINUED] antiseptic oral rinse  15 mL Mouth Rinse q12n4p  . [DISCONTINUED] chlorhexidine  15 mL Mouth Rinse BID  . [DISCONTINUED] levothyroxine  100 mcg Intravenous Daily   Assessment: 57yo female with h/o Crohn's dz admitted due to confusion with n/v and abdominal pain.  Pt has good renal fxn.  Estimated Creatinine Clearance: 62.1 ml/min (by C-G formula based on Cr of 0.81).  Pt started on Flagyl and Cipro empirically.  Goal of Therapy:  Eradicate infection.  Plan: Cipro 400mg  IV q12hrs Continue Flagyl Monitor labs and renal fxn Duration of therapy per MD  Valrie Hart A 11/15/2012,8:06 AM
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
## 2012-11-15 NOTE — Progress Notes (Signed)
Brother  Monzerrat Wellen  Contact at  [phone] Aware Patient has been admitted.  Patient states she does not want any information shared about her condition. None shared other than she is admitted to the hospital.  [facility]

## 2012-11-15 NOTE — Progress Notes (Addendum)
INITIAL NUTRITION ASSESSMENT  DOCUMENTATION CODES Per approved criteria  -Not Applicable   INTERVENTION:  ProStat 30 ml TID (300 kcal,45 gr protein)  Unable to add standard oral supplement due to pt calcium-containing compound allergy   NUTRITION DIAGNOSIS: Inadequate oral intake related to enteritis as evidenced by abdominal pain, nausea, vomiting PTA and 0% observed meal intake   Goal: Pt to meet >/= 90% of their estimated nutrition needs  Monitor:  Diet advancement and tolerance, wt trends and labs  Reason for Assessment: Malnutrition Screen  57 y.o. female  Admitting Dx: Enteritis  ASSESSMENT:  Pt has hx of chronic pain presented with nausea, vomiting c/o abdominal pain, and dehydrated. Her speech is slurred and seems drowsy but answers questions appropriately. Weight hx reviewed. She's receiving  Clear liquids currently and lunch tray remains untouched. Pt does not meet criteria for malnutrition at this time but is certainly at risk given her medical hx and current acute illness.   Height: Ht Readings from Last 1 Encounters:  11/15/12 5\' 2"  (1.575 m)    Weight: Wt Readings from Last 1 Encounters:  11/15/12 118 lb 6.2 oz (53.7 kg)    Ideal Body Weight: 110# (50 kg)  % Ideal Body Weight: 108%  Wt Readings from Last 10 Encounters:  11/15/12 118 lb 6.2 oz (53.7 kg)  11/03/12 114 lb (51.71 kg)  10/27/12 114 lb (51.71 kg)  10/24/12 114 lb (51.71 kg)    Usual Body Weight: 114# (51.7 kg)  % Usual Body Weight:   BMI:  Body mass index is 21.65 kg/(m^2).  Estimated Nutritional Needs: Kcal:1325-1590   Protein: 58-70 gr Fluid: > 1600 ml/day  Skin: no issues noted  Diet Order: Clear Liquid  EDUCATION NEEDS: -Education not appropriate at this time   Intake/Output Summary (Last 24 hours) at 11/15/12 1606 Last data filed at 11/15/12 1228  Gross per 24 hour  Intake 1821.42 ml  Output    800 ml  Net 1021.42 ml    Last BM: 11/14/12  Labs:   Recent  Labs Lab 11/14/12 2136 11/15/12 0518  NA 136 137  K <2.0* <2.0*  CL 93* 97  CO2 31 31  BUN 6 6  CREATININE 0.81 0.81  CALCIUM 8.1* 7.9*  MG 2.0 1.9  GLUCOSE 171* 106*    CBG (last 3)  No results found for this basename: GLUCAP,  in the last 72 hours  Scheduled Meds: . ciprofloxacin  400 mg Intravenous Q12H  . enoxaparin (LOVENOX) injection  40 mg Subcutaneous Q24H  . fentaNYL  100 mcg Transdermal Q72H  . metronidazole  500 mg Intravenous Q8H  . pantoprazole (PROTONIX) IV  40 mg Intravenous Q24H  . potassium chloride  20 mEq Oral TID  . sodium chloride  3 mL Intravenous Q12H    Continuous Infusions: . 0.9 % NaCl with KCl 40 mEq / L 75 mL/hr at 11/15/12 1100    Past Medical History  Diagnosis Date  . Arthritis   . Hypothyroidism   . Hypertension   . Bladder wall thickening 11/15/2012  . Enteritis 11/15/2012  . Hypokalemia 11/15/2012  . Chronic pain syndrome     History reviewed. No pertinent past surgical history.  Royann Shivers MS,RD,LDN,CSG Office: [phone] Pager: [phone]

## 2012-11-15 NOTE — H&P (Addendum)
[facility] History and Physical  [name]  WUJ:[id number]  DOB: 11/25/1955   DOA: 11/15/2012   PCP:   Junious Silk, MD   Chief Complaint:  Acute confusion  HPI: [name] is an 57 y.o. female.   Thin middle-aged Caucasian lady with an unclear past medical history which will need to be common for him with her physicians during the day. She was reportedly brought in by family members because she was found to be confused at home and having nausea and vomiting and abdominal pain. In the emergency room she was emergently given  a dose of intravenous Synthroid, because she has a history of hypothyroidism treated with Synthroid injection, and it was felt she may be in hypertensive crisis. In any event blood work later showed her to be markedly hypokalemic, and a CT scan of the abdomen revealed evidence of colitis. She was admitted to the step down unit for evaluation by the hospitalist.  When seen by the hospitalists patient is dehydrated and sedated having received morphine, but able to give a history that she suffers with Crohn's disease, as not had a flare for some time, has not been vomiting but been having 3-4 diarrheal stools per day for the past  5 days, and getting progressively weaker; she denies blood in the stool. She denies fever or chills.  Additionally she says she suffers from chronic rectal pain and sees Dr. Thyra Breed at the pain clinic for pain medication. She reports her medications include a 75 mcg fentanyl patch that she uses when necessary, as well as tablets. She is upset because her brother has been taking her medication, it is unclear to me if she is taking them to prevent her from overdosing or if he stealing them for his own use. The ICU and nurse reports that she removed 4 fentanyl patches from the patient's body.  Additionally she reports that she suffers from hypothyroidism, but is unable to absorb Synthroid, so Dr. Leslie Dales treats her with  intramuscular Synthroid every 2 weeks.  Rewiew of Systems:   Other than diarrhea, abdominal pain, and weakness patient denies any other issues on review of systems.  All systems negative except as marked bold or noted in the HPI;  Constitutional:    malaise, fever and chills. ;  Eyes:   eye pain, redness and discharge. ;  ENMT:   ear pain, hoarseness, nasal congestion, sinus pressure and sore throat. ;  Cardiovascular:    chest pain, palpitations, diaphoresis, dyspnea and peripheral edema.  Respiratory:   cough, hemoptysis, wheezing and stridor. ;  Gastrointestinal:  nausea, vomiting, diarrhea, constipation, abdominal pain, melena, blood in stool, hematemesis, jaundice and rectal bleeding. unusual weight loss..   Genitourinary:    frequency, dysuria, incontinence,flank pain and hematuria; Musculoskeletal:   back pain and neck pain.  swelling and trauma.;  Skin: .  pruritus, rash, abrasions, bruising and skin lesion.; ulcerations Neuro:    headache, lightheadedness and neck stiffness.  weakness, altered level of consciousness, altered mental status, extremity weakness, burning feet, involuntary movement, seizure and syncope.  Psych:    anxiety, depression, insomnia, tearfulness, panic attacks, hallucinations, paranoia, suicidal or homicidal ideation    Past Medical History  Diagnosis Date  . Arthritis   . Thyroid disease   . Hypertension     History reviewed. No pertinent past surgical history.  Medications:  HOME MEDS: Prior to Admission medications   Medication Sig Start Date End Date Taking? Authorizing Provider  ALPRAZolam Prudy Feeler) 1  MG tablet Take 1 mg by mouth at bedtime as needed for sleep.   Yes Historical Provider, MD  Metoprolol Succinate (TOPROL XL PO) Take 1 tablet by mouth daily.   Yes Historical Provider, MD  Morphine Sulfate (MS CONTIN PO) Take 1 capsule by mouth at bedtime as needed (pain).   Yes Historical Provider, MD     Allergies:  Allergies  Allergen  Reactions  . Aspirin Rash  . Calcium-Containing Compounds Rash  . Codeine Rash  . Tagamet (Cimetidine) Rash    Social History:   reports that she quit smoking about 4 years ago. She has quit using smokeless tobacco. She reports that she does not drink alcohol or use illicit drugs.  Family History: Family History  Problem Relation Age of Onset  . Cancer Mother     bone  . COPD Father      Physical Exam: Filed Vitals:   11/14/12 2330 11/15/12 0158 11/15/12 0300 11/15/12 0313  BP: 125/75 134/84    Pulse:  80    Temp:    97.8 F (36.6 C)  TempSrc:    Oral  Resp:  20    Height:   5\' 2"  (1.575 m) 5\' 2"  (1.575 m)  Weight:   53.7 kg (118 lb 6.2 oz) 53.7 kg (118 lb 6.2 oz)  SpO2:  96%     Blood pressure 134/84, pulse 80, temperature 97.8 F (36.6 C), temperature source Oral, resp. rate 20, height 5\' 2"  (1.575 m), weight 53.7 kg (118 lb 6.2 oz), SpO2 96.00%.  GEN:  Ill-looking, drowsy thin middle-aged Caucasian lady lying bed; cooperative with exam PSYCH:  alert and oriented x4;  affect is appropriate. HEENT: Mucous membranes pink, dry and anicteric; PERRLA; EOM intact; no cervical lymphadenopathy nor thyromegaly or carotid bruit; no JVD; Breasts:: Not examined CHEST WALL: No tenderness CHEST: Normal respiration, clear to auscultation bilaterally HEART: Regular rate and rhythm; no murmurs rubs or gallops BACK: No kyphosis no scoliosis; no CVA tenderness ABDOMEN:  Soft, left anterior flank tenderness, no rebound ; no masses, no organomegaly, normal abdominal bowel sounds; no pannus; no intertriginous candida. Rectal Exam: Not done EXTREMITIES: No bone or joint deformity; age-appropriate arthropathy of the hands and knees; no edema; no ulcerations. Genitalia: not examined PULSES: 2+ and symmetric SKIN: Coarse dry skin; no ulceration CNS: Cranial nerves 2-12 grossly intact no focal lateralizing neurologic deficit   Labs on Admission:  Basic Metabolic Panel:  Recent Labs Lab  11/14/12 2136  NA 136  K <2.0*  CL 93*  CO2 31  GLUCOSE 171*  BUN 6  CREATININE 0.81  CALCIUM 8.1*  MG 2.0   Liver Function Tests:  Recent Labs Lab 11/14/12 2136  AST 36  ALT 20  ALKPHOS 67  BILITOT 0.4  PROT 6.4  ALBUMIN 2.6*    Recent Labs Lab 11/14/12 2136  LIPASE 26   No results found for this basename: AMMONIA,  in the last 168 hours CBC:  Recent Labs Lab 11/14/12 2136  WBC 5.0  NEUTROABS 4.1  HGB 12.8  HCT 37.2  MCV 91.6  PLT 323   Cardiac Enzymes:  Recent Labs Lab 11/14/12 2136  TROPONINI <0.30   BNP: No components found with this basename: POCBNP,  D-dimer: No components found with this basename: D-DIMER,  CBG: No results found for this basename: GLUCAP,  in the last 168 hours  Radiological Exams on Admission: Dg Chest Portable 1 View  11/14/2012  *RADIOLOGY REPORT*  Clinical Data: Abdominal pain, altered mental  status, history hypertension, former smoker  PORTABLE CHEST - 1 VIEW  Comparison: Portable exam 2145 hours compared to 04/17/2012  Findings: Lordotic positioning. Right subclavian Port-A-Cath with tip projecting over proximal SVC. Normal heart size, mediastinal contours, and pulmonary vascularity. Emphysematous and minimal bronchitic changes question COPD. No acute infiltrate, pleural effusion or pneumothorax. Probable right nipple shadow. No pleural effusion or pneumothorax.  IMPRESSION: Question COPD. No acute abnormalities.   Original Report Authenticated By: Ulyses Southward, M.D.    Ct Cta Abd/pel W/cm &/or W/o Cm  11/15/2012  *RADIOLOGY REPORT*  Clinical Data: Left-sided abdominal pain  CT ANGIOGRAPHY ABDOMEN AND PELVIS WITH CONTRAST AND WITHOUT CONTRAST  Contrast:  100 ml Omnipaque three fifty  Comparison: None.  Findings: Lung bases are predominately clear with mild areas of scarring along the periphery posteriorly.  Heart size within normal limits.  There is scattered atherosclerotic disease of the aorta and branch vessels including  advanced atherosclerotic plaque of the aorta at the level of the IMA origin which results in luminal narrowing of 50%.  Poststenotic ectasia at 1.8 cm.  The aorta measures 1.5 cm above the level of the stenosis.  The celiac axis, SMA, single renal arteries, and IMA remain patent. There is advanced atherosclerotic disease of the bilateral common iliac arteries without aneurysmal dilatation.  Iliac branch vessels remain patent.  Organ evaluation is limited in the arterial and delayed phases. Allowing for this, low attenuation of the liver suggests fatty infiltration.  Unremarkable spleen, pancreas, adrenal glands.  Absent gallbladder.  Mild biliary ductal dilatation is nonspecific.  Symmetric renal enhancement.  No hydronephrosis or hydroureter.  No CT evidence for colitis.  Mild colonic diverticulosis.  There are several thickened small bowel loops with mucosal hyperenhancement (see series 4 image 118 as index).  There is mesenteric fat stranding and intraperitoneal fluid within the abdomen pelvis.  No free intraperitoneal air.  No lymphadenopathy.  Bladder wall thickening along the anterior and lateral margins, somewhat nodular on the right as seen on coronal image 69. Relative sparing posteriorly.  No acute osseous finding.  IMPRESSION: Advanced atherosclerotic disease of the aorta as above. Branch vessels remain patent.  Thickened loops of small bowel with mucosal hyperenhancement.  This is in keeping with a nonspecific enteritis (infectious, ischemic, and inflammatory considerations).  There is free intraperitoneal fluid which may be reactive.  Hepatic steatosis.  Enhancing bladder wall, somewhat nodular on the right.  Recommend urinalysis and cystoscopy correlation.   Original Report Authenticated By: Jearld Lesch, M.D.     EKG: Independently reviewed. Sinus rhythm long QT   Assessment/Plan Present on Admission:  . Hypokalemia  Will give 3 further runs of IV potassium, prior hydrated with saline and  potassium and a separate IV , before rechecking  potassium and magnesium. 3 doses of oral potassium.  . Enteritis, likely Crohn's   Start Cipro and Flagyl, consult gastroenterologist for assistance with management; start clear liquid diet  . Abdominal pain  Give when necessary morphine since she seems to have a history of chronic narcotic use  . Dehydration  IV fluids; a clear liquid diet  . Encephalopathy, metabolic, i  Cheek underlying conditions, supportive care  . Thyroiditis, chronic, lymphocytic  Check a free T4 and TSH on blood draw on first arrival to the emergency room fvasculopathy, likely secondary to hypothyroidism  Management of hyperthyroidism; management of cardiovascular risk factors   hyperglycemia   check hemoglobin A1c    Other plans as per orders.  Code Status:  FULL CODE  Family Communication: With patient at bedside Disposition Plan: Likely home when stable  Critical care time: 60 minutes.   CAMPBELL,LEOPOLD Nocturnist [facility] Pager [phone]   11/15/2012, 4:29 AM

## 2012-11-15 NOTE — Consult Note (Signed)
Please dictated note. Acute diarrhea may be secondary to narcotic withdrawal or an infection.  Doubt IBD. She has had diarrhea for yearsand has done well with nrcotics primarily for back pain. Will advance diet and request stool culture, O&P and Fecal lactoferrin to complete stool studies.

## 2012-11-15 NOTE — Progress Notes (Signed)
On-call MD paged and informed about 11/15/12 morning EKG results.

## 2012-11-15 NOTE — Progress Notes (Signed)
CRITICAL VALUE ALERT  Critical value received:  K+ 2.7  Date of notification:  11/16/02  Time of notification:  1700  Critical value read back:yes  Nurse who received alert:  Mirian Capuchin RN  MD notified (1st page):  Dr Sherrie Mustache  Time of first page:  1701  MD notified (2nd page):  Time of second page:  Responding MD:  Dr Sherrie Mustache  Time MD responded:  [phone]

## 2012-11-15 NOTE — Progress Notes (Addendum)
Called re: Multiple runs of VT, 28 beat run of VT witnessed and 41 beats longest run, occur every 2-3 minutes, patient remains lethargic but AOX3. She is a very poor historian and cannot give me full details about her condition. She reports intermittent chest pain, but none currently except where her porta-cath was accessed which failed.  Work up this evening reveals the following abnormalities:  1. Elevated troponin 1.8, abnormal worrisome for NSTEMI given VT and ST depression, no chest pain but she has nausea, she is also on chronic opiates for pain. Cycling enzymes.  2. Profound Hypokalemia on admission with Prolonged QT/cardiac abnormalities- now 2.78 after very high volume repletion (multiple runs #5, 40 PO and 40 meq NS continuous infusion). Repeat tonight is 2.6, will continue to give runs. PICC Line ordered. No Diarrhea since admission to account for K losses.  3. Abnormal EKG, confirmed VT on strip, now in bigeminy, ectopy concerning for ACS vs. Electrolyte abnormalities.  4. Thyroid Disease, known history of significant hypothyroid, T4 ok but TSH is 14.   5. Has old porta-cath for synthroid, attempted access unsuccessfully.   IP Cardiology Consult Placed  Empiric Heparin Started for NSTEMI  Plavix load given, she refused ASA due to allergy.  Will consider Central Line placement, difficult IV stick, was able to use Ultrasound and place an 18 gauge IV in her Navos with good return and flush.   Given frequency and increasing length of her VT and +trops will start her on Amiodarone load and infusion until cardiology can evaluate in AM, she is stable currently and does not require urgent transport to [facility].  History of long term chronic pain and possible opiate misuse, overuse-monitoring mental status.  Re-ordered IV KCl runs, and oral repetion   Anderson Malta, DO Palliative Medicine

## 2012-11-15 NOTE — Progress Notes (Signed)
The patient is a 56 year old woman admitted this morning with confusion. She has had diarrhea for several days. She was found to have 4 fentanyl patches on, but she is prescribed two 75 mcg patches every 2 days. She was found to have a serum potassium of less than 2. Her magnesium level was within normal limits. She was briefly seen and examined. Her chart, vital signs, and laboratory studies were reviewed.  GI consult pending. Continue Flagyl and Cipro. Change IV fluids to normal saline with 40 mEq potassium chloride added. Continue IV potassium runs. Will add oral potassium as well. Will check another basic metabolic panel in several hours. We'll check C. difficile PCR pending. Will restart fentanyl patches at 100 mcg every 2 days to avoid withdrawal syndrome.

## 2012-11-16 ENCOUNTER — Inpatient Hospital Stay (HOSPITAL_COMMUNITY): Payer: Medicare Other

## 2012-11-16 ENCOUNTER — Encounter (HOSPITAL_COMMUNITY): Payer: Medicare Other

## 2012-11-16 DIAGNOSIS — I472 Ventricular tachycardia: Secondary | ICD-10-CM

## 2012-11-16 DIAGNOSIS — R197 Diarrhea, unspecified: Secondary | ICD-10-CM

## 2012-11-16 DIAGNOSIS — I4729 Other ventricular tachycardia: Secondary | ICD-10-CM | POA: Diagnosis present

## 2012-11-16 DIAGNOSIS — I214 Non-ST elevation (NSTEMI) myocardial infarction: Secondary | ICD-10-CM

## 2012-11-16 DIAGNOSIS — R739 Hyperglycemia, unspecified: Secondary | ICD-10-CM | POA: Diagnosis present

## 2012-11-16 DIAGNOSIS — I219 Acute myocardial infarction, unspecified: Secondary | ICD-10-CM

## 2012-11-16 DIAGNOSIS — K5289 Other specified noninfective gastroenteritis and colitis: Secondary | ICD-10-CM

## 2012-11-16 DIAGNOSIS — E039 Hypothyroidism, unspecified: Secondary | ICD-10-CM

## 2012-11-16 DIAGNOSIS — R109 Unspecified abdominal pain: Secondary | ICD-10-CM

## 2012-11-16 DIAGNOSIS — I519 Heart disease, unspecified: Secondary | ICD-10-CM

## 2012-11-16 HISTORY — DX: Other ventricular tachycardia: I47.29

## 2012-11-16 HISTORY — DX: Ventricular tachycardia: I47.2

## 2012-11-16 HISTORY — DX: Non-ST elevation (NSTEMI) myocardial infarction: I21.4

## 2012-11-16 LAB — COMPREHENSIVE METABOLIC PANEL
AST: 31 U/L (ref 0–37)
Albumin: 2.2 g/dL — ABNORMAL LOW (ref 3.5–5.2)
Alkaline Phosphatase: 51 U/L (ref 39–117)
Chloride: 104 mEq/L (ref 96–112)
Potassium: 2.6 mEq/L — CL (ref 3.5–5.1)
Total Bilirubin: 0.3 mg/dL (ref 0.3–1.2)

## 2012-11-16 LAB — BASIC METABOLIC PANEL
BUN: 4 mg/dL — ABNORMAL LOW (ref 6–23)
Calcium: 7.5 mg/dL — ABNORMAL LOW (ref 8.4–10.5)
GFR calc non Af Amer: >90 mL/min (ref >90)
Glucose, Bld: 87 mg/dL (ref 70–99)
Potassium: 2.7 mEq/L — CL (ref 3.5–5.1)

## 2012-11-16 LAB — CBC
Platelets: 273 10*3/uL (ref 150–400)
RDW: 15.1 % (ref 11.5–15.5)
WBC: 6.6 10*3/uL (ref 4.0–10.5)

## 2012-11-16 LAB — HEPARIN LEVEL (UNFRACTIONATED): Heparin Unfractionated: 0.41 IU/mL (ref 0.30–0.70)

## 2012-11-16 LAB — CK TOTAL AND CKMB (NOT AT ARMC)
CK, MB: 7.3 ng/mL (ref 0.3–4.0)
Relative Index: 4.2 — ABNORMAL HIGH (ref 0.0–2.5)
Total CK: 242 U/L — ABNORMAL HIGH (ref 7–177)

## 2012-11-16 LAB — GLUCOSE, CAPILLARY
Glucose-Capillary: 92 mg/dL (ref 70–99)
Glucose-Capillary: 96 mg/dL (ref 70–99)

## 2012-11-16 MED ORDER — AMIODARONE LOAD VIA INFUSION
150.0000 mg | Freq: Once | INTRAVENOUS | Status: AC
  Administered 2012-11-16: 150 mg via INTRAVENOUS
  Filled 2012-11-16: qty 83.34

## 2012-11-16 MED ORDER — POTASSIUM CHLORIDE 20 MEQ/15ML (10%) PO LIQD
40.0000 meq | Freq: Two times a day (BID) | ORAL | Status: DC
  Filled 2012-11-16: qty 30

## 2012-11-16 MED ORDER — HEPARIN (PORCINE) IN NACL 100-0.45 UNIT/ML-% IJ SOLN
700.0000 [IU]/h | INTRAMUSCULAR | Status: DC
  Administered 2012-11-16: 700 [IU]/h via INTRAVENOUS
  Filled 2012-11-16: qty 250

## 2012-11-16 MED ORDER — HEPARIN BOLUS VIA INFUSION
2500.0000 [IU] | Freq: Once | INTRAVENOUS | Status: AC
  Administered 2012-11-16: 2500 [IU] via INTRAVENOUS
  Filled 2012-11-16: qty 2500

## 2012-11-16 MED ORDER — POTASSIUM CHLORIDE 10 MEQ/100ML IV SOLN
10.0000 meq | INTRAVENOUS | Status: AC
  Administered 2012-11-16: 10 meq via INTRAVENOUS
  Filled 2012-11-16: qty 100
  Filled 2012-11-16: qty 300

## 2012-11-16 MED ORDER — CLOPIDOGREL BISULFATE 75 MG PO TABS
300.0000 mg | ORAL_TABLET | Freq: Once | ORAL | Status: AC
  Administered 2012-11-16: 300 mg via ORAL
  Filled 2012-11-16: qty 4

## 2012-11-16 MED ORDER — SODIUM CHLORIDE 0.9 % IJ SOLN
10.0000 mL | Freq: Two times a day (BID) | INTRAMUSCULAR | Status: DC
  Administered 2012-11-17: 10 mL
  Administered 2012-11-17 – 2012-11-18 (×2): 20 mL
  Administered 2012-11-19: 10 mL
  Filled 2012-11-16: qty 3

## 2012-11-16 MED ORDER — POTASSIUM CHLORIDE 10 MEQ/100ML IV SOLN
10.0000 meq | INTRAVENOUS | Status: AC
  Administered 2012-11-17 (×6): 10 meq via INTRAVENOUS
  Filled 2012-11-16: qty 600

## 2012-11-16 MED ORDER — ASPIRIN 325 MG PO TABS
ORAL_TABLET | ORAL | Status: AC
  Filled 2012-11-16: qty 1

## 2012-11-16 MED ORDER — POTASSIUM CHLORIDE 20 MEQ/15ML (10%) PO LIQD
40.0000 meq | Freq: Three times a day (TID) | ORAL | Status: DC
  Administered 2012-11-17 – 2012-11-19 (×6): 40 meq via ORAL
  Filled 2012-11-16 (×7): qty 30

## 2012-11-16 MED ORDER — AMIODARONE HCL IN DEXTROSE 360-4.14 MG/200ML-% IV SOLN
60.0000 mg/h | INTRAVENOUS | Status: AC
  Administered 2012-11-16 (×2): 60 mg/h via INTRAVENOUS
  Filled 2012-11-16 (×2): qty 200

## 2012-11-16 MED ORDER — POTASSIUM CHLORIDE 10 MEQ/100ML IV SOLN
10.0000 meq | INTRAVENOUS | Status: AC
  Administered 2012-11-16 (×3): 10 meq via INTRAVENOUS

## 2012-11-16 MED ORDER — SODIUM CHLORIDE 0.9 % IJ SOLN
10.0000 mL | INTRAMUSCULAR | Status: DC | PRN
  Administered 2012-11-17: 10 mL

## 2012-11-16 MED ORDER — AMIODARONE HCL IN DEXTROSE 360-4.14 MG/200ML-% IV SOLN
30.0000 mg/h | INTRAVENOUS | Status: DC
  Administered 2012-11-16 – 2012-11-17 (×2): 30 mg/h via INTRAVENOUS
  Filled 2012-11-16 (×2): qty 200

## 2012-11-16 MED ORDER — INSULIN ASPART 100 UNIT/ML ~~LOC~~ SOLN: 0.0000 [IU] | Freq: Three times a day (TID) | SUBCUTANEOUS | Status: DC

## 2012-11-16 NOTE — Progress Notes (Signed)
Power flushed x 11 after sitting patient up right in bed and making her lean forward to help line drop. Will rexay in about 30 mins.

## 2012-11-16 NOTE — Consult Note (Signed)
CARDIOLOGY CONSULT NOTE  Patient ID: [name] MRN: [id number] DOB/AGE: 56-Jul-1957 56 y.o.  Admit date: 11/14/2012 Referring Physician: [facility] Primary PhysicianALTHEIMER,MICHAEL D, MD Primary Cardiologist: (New) Reason for Consultation: Elevated troponin and NSVT Principal Problem:   Enteritis Active Problems:   Hypokalemia   Abdominal pain   Dehydration   Encephalopathy, metabolic   Thyroiditis, chronic, lymphocytic   Bladder wall thickening   Chronic pain syndrome   Hematuria, microscopic   Hypothyroidism (acquired)   Non-ST elevation MI (NSTEMI)   Non-sustained ventricular tachycardia  HPI: [name] is a 57 year-old unfortunate CF with multiple medical problems admitted after being found in her home having fallen and in a confused state. She states that she has been having diarrhea for the last 2 weeks prior to admission with abdominal pain. She also states she has not been taking her medications for the last 2 weeks, "my brother took my medicines away." On arrival the ER she was found to be hypokalemic with a potassium of 2.6, with an elevated troponin of 1.81, rising to 2.58. CK-MB 10.1 and 7.3 respectively. Creatinine was 0.70. TSH was significantly elevated at 14.482. EKG revealed sinus tachycardia, prolonged QT interval of 5.60/QTc 621;  with T-wave inversion inferiorly. She also exhibited several episodes of nonsustained ventricular tachycardia. We are asked for cardiac recommendations in the setting of non-ST elevation MI.    On further questioning with this patient she states she has been seen in the remote past by a cardiologist in [place] secondary to "angina" but does not remember which tests were completed. Echocardiogram has been completed but not yet read. She remains on IV heparin, has been placed on Plavix, and amiodarone drip. Heart rate is now controlled in the 50s without evidence of recurrent arrhythmia.   Review of systems complete and found to be negative  unless listed above   Past Medical History  Diagnosis Date  . Arthritis   . Hypothyroidism   . Hypertension   . Bladder wall thickening 11/15/2012  . Enteritis 11/15/2012  . Hypokalemia 11/15/2012  . Chronic pain syndrome     Family History  Problem Relation Age of Onset  . Cancer Mother     bone  . COPD Father     History   Social History  . Marital Status: Single    Spouse Name: N/A    Number of Children: N/A  . Years of Education: N/A   Occupational History  . Not on file.   Social History Main Topics  . Smoking status: Former Smoker -- 0.75 packs/day for 35 years    Quit date: 11/15/2008  . Smokeless tobacco: Former Neurosurgeon  . Alcohol Use: No  . Drug Use: No  . Sexually Active: Not Currently   Other Topics Concern  . Not on file   Social History Narrative  . No narrative on file    History reviewed. No pertinent past surgical history.   Prescriptions prior to admission  Medication Sig Dispense Refill  . cyclobenzaprine (FLEXERIL) 10 MG tablet Take 10 mg by mouth 3 (three) times daily as needed for muscle spasms (pain).      . diazepam (VALIUM) 10 MG tablet Take 10 mg by mouth every 8 (eight) hours as needed for anxiety.      . fentaNYL (DURAGESIC - DOSED MCG/HR) 75 MCG/HR Place 1 patch onto the skin every other day.      . metoprolol (TOPROL-XL) 200 MG 24 hr tablet Take 200 mg by mouth daily.      Marland Kitchen  morphine (MS CONTIN) 60 MG 12 hr tablet Take 180 mg by mouth every 6 (six) hours as needed for pain.        Physical Exam: Blood pressure 136/78, pulse 58, temperature 97.7 F (36.5 C), temperature source Oral, resp. rate 14, height 5\' 2"  (1.575 m), weight 118 lb 9.7 oz (53.8 kg), SpO2 93.00%.   General: Well developed, thin,, in no acute distress, still mildly confused. Head: Eyes PERRLA, No xanthomas.   Normal cephalic and atramatic  Lungs: Clear bilaterally to auscultation and percussion. Heart: HRRR S1 S2,soft systolic murmur.  Pulses are 2+ & equal.             No carotid bruit. No JVD.  No abdominal bruits. No femoral bruits. Abdomen: Bowel sounds are positive,jyperactive, abdomen soft and with tenderness to palpation without masses or                  Hernia's noted. Msk:  Back normal. Diminished strength and tone for age. Extremities: No clubbing, cyanosis or edema.  DP +1 Neuro: Alert and oriented x2  Psych:  Good affect, some confusion  Labs:   Lab Results  Component Value Date   WBC 6.6 11/16/2012   HGB 11.1* 11/16/2012   HCT 32.2* 11/16/2012   MCV 92.8 11/16/2012   PLT 273 11/16/2012    Recent Labs Lab 11/16/12 0341  NA 139  K 2.6*  CL 104  CO2 26  BUN 5*  CREATININE 0.70  CALCIUM 7.5*  PROT 5.1*  BILITOT 0.3  ALKPHOS 51  ALT 14  AST 31  GLUCOSE 159*   Lab Results  Component Value Date   CKTOTAL 175 11/16/2012   CKMB 7.3* 11/16/2012   TROPONINI 2.58* 11/16/2012        Radiology: Dg Chest Portable 1 View  11/14/2012  *RADIOLOGY REPORT*  Clinical Data: Abdominal pain, altered mental status, history hypertension, former smoker  PORTABLE CHEST - 1 VIEW  Comparison: Portable exam 2145 hours compared to 04/17/2012  Findings: Lordotic positioning. Right subclavian Port-A-Cath with tip projecting over proximal SVC. Normal heart size, mediastinal contours, and pulmonary vascularity. Emphysematous and minimal bronchitic changes question COPD. No acute infiltrate, pleural effusion or pneumothorax. Probable right nipple shadow. No pleural effusion or pneumothorax.  IMPRESSION: Question COPD. No acute abnormalities.   Original Report Authenticated By: Ulyses Southward, M.D.    Ct Cta Abd/pel W/cm &/or W/o Cm  11/15/2012  *RADIOLOGY REPORT*  Clinical Data: Left-sided abdominal pain  CT ANGIOGRAPHY ABDOMEN AND PELVIS WITH CONTRAST AND WITHOUT CONTRAST   IMPRESSION: Advanced atherosclerotic disease of the aorta as above. Branch vessels remain patent.  Thickened loops of small bowel with mucosal hyperenhancement.  This is in keeping with a  nonspecific enteritis (infectious, ischemic, and inflammatory considerations).  There is free intraperitoneal fluid which may be reactive.  Hepatic steatosis.  Enhancing bladder wall, somewhat nodular on the right.  Recommend urinalysis and cystoscopy correlation.   Original Report Authenticated By: Jearld Lesch, M.D.    ZOX:WRUEA rhythm rate of 74 bpm with T-wave inversion in the inferior leads, prolonged QTc 620 ms.   1. NSTEMI: Elevation of troponin 1.81-2.58, with elevation and CK-MB, EKG changes indicative of inferior MI. This is multifactorial in the setting of significant hypothyroidism, hypokalemia dehydration and medical noncompliance. Echocardiogram is completed with results pending. If significant LV systolic function is noted, the patient would benefit from cardiac catheterization to evaluate further for CAD with CVRF.Check fasting lipids and LFT's in am.  Would continue  heparin, consider adding  low-dose beta blocker later. She is unable to take ASA due to rash.  Review of home medications demonstrates that she is on metoprolol 200 mg daily. We will repeat EKG. Continue to cycle cardiac enzymes.  2. NSVT: Etiology most likely related to hypokalemia, and hypothyroidism. She continues on amiodarone drip, loading is not yet complete.Now that electrolyte disturbance is resolved, uncertain if this medication is warranted as this can contribute to hypothyroidism symptoms. Will discuss with Dr. Daleen Squibb.  3.Hypothyroidism: Significantly elevated TSH on admission at 14.48 with thyroiditis She was given IV Synthroid in the ER. She apparently is unable to insert absorbe PO  mouth Synthroid, PCP provides her with intramuscular Synthroid every 2 weeks.  4. Chronic pain: She is followed by Dr. Thyra Breed for pain medication and management. She apparently has Fentanyl patches along with oxycodone for pain control.  5 . Crohns Disease: Significant diarrhea, self-reported, for the last 2 weeks prior to  admission with associated abdominal pain. She is being treated for enteritis with Cipro and Flagyl. GI consult is underway.  6. Hypokalemia: This has been repleted in IV form. Followup labs are pending at 2pm.   ASSESSMENT AND PLAN:  Signed: Bettey Mare. Lyman Bishop NP [facility] 11/16/2012, 8:47 AM Co-Sign MD I have taken a history, reviewed medications, allergies, PMH, SH, FH, and reviewed ROS and examined the patient.  I agree with the assessment and plan. Once her potassium is normal, magnesium is normal, echo shows good LV systolic function, would continue IV amiodarone.  Continue to track enzymes. At this point, I would pursue medical therapy considering other multiple comorbidities. I would do this particularly if her LV function is normal and there is no significant segmental wall motion abnormalities.  Thomas C. Daleen Squibb, MD, [facility] Pager:  [phone]

## 2012-11-16 NOTE — Progress Notes (Signed)
*  PRELIMINARY RESULTS* Echocardiogram 2D Echocardiogram has been performed.  [name], Olivia Jane 11/16/2012, 1:38 PM 

## 2012-11-16 NOTE — Progress Notes (Signed)
Critical K 2.6 called to MD. New orders received

## 2012-11-16 NOTE — Progress Notes (Signed)
ANTICOAGULATION CONSULT NOTE   Pharmacy Consult for Heparin Indication: chest pain/ACS/VFib  Allergies  Allergen Reactions  . Aspirin Rash  . Calcium-Containing Compounds Rash  . Codeine Rash  . Tagamet (Cimetidine) Rash   Patient Measurements: Height: 5\' 2"  (157.5 cm) Weight: 118 lb 9.7 oz (53.8 kg) IBW/kg (Calculated) : 50.1 Heparin Dosing Weight: 53.7kg  Vital Signs: Temp: 97.6 F (36.4 C) (03/13 0800) Temp src: Oral (03/13 0800) BP: 121/88 mmHg (03/13 0800) Pulse Rate: 64 (03/13 0800)  Labs:  Recent Labs  11/14/12 2136 11/15/12 0518 11/15/12 1608 11/15/12 2142 11/15/12 2345 11/16/12 0341 11/16/12 0647  HGB 12.8  --   --  13.1  --  11.1*  --   HCT 37.2  --   --  38.5  --  32.2*  --   PLT 323  --   --  330  --  273  --   APTT  --  28  --   --   --   --   --   LABPROT  --  13.0  --   --   --   --   --   INR  --  0.99  --   --   --   --   --   HEPARINUNFRC  --   --   --   --   --   --  0.41  CREATININE 0.81 0.81 0.73 0.79  --  0.70  --   CKTOTAL  --   --   --   --  242* 175  --   CKMB  --   --   --   --  10.1* 7.3*  --   TROPONINI <0.30  --   --  1.81*  --  2.58*  --     Estimated Creatinine Clearance: 62.8 ml/min (by C-G formula based on Cr of 0.7).   Medical History: Past Medical History  Diagnosis Date  . Arthritis   . Hypothyroidism   . Hypertension   . Bladder wall thickening 11/15/2012  . Enteritis 11/15/2012  . Hypokalemia 11/15/2012  . Chronic pain syndrome     Medications:  Scheduled:  . [COMPLETED] amiodarone  150 mg Intravenous Once  . [COMPLETED] aspirin      . ciprofloxacin  400 mg Intravenous Q12H  . [COMPLETED] clopidogrel  300 mg Oral Once  . feeding supplement  30 mL Oral TID WC  . fentaNYL  100 mcg Transdermal Q48H  . [COMPLETED] heparin  2,500 Units Intravenous Once  . insulin aspart  0-9 Units Subcutaneous TID WC  . metronidazole  500 mg Intravenous Q8H  . pantoprazole (PROTONIX) IV  40 mg Intravenous Q24H  . [COMPLETED]  potassium chloride  10 mEq Intravenous Q1 Hr x 5  . potassium chloride  10 mEq Intravenous Q1 Hr x 4  . potassium chloride  40 mEq Oral BID  . [EXPIRED] potassium chloride  20 mEq Oral TID  . sodium chloride  10-40 mL Intracatheter Q12H  . sodium chloride  3 mL Intravenous Q12H  . [COMPLETED] spironolactone  25 mg Oral Once  . [DISCONTINUED] aspirin  325 mg Oral Once  . [DISCONTINUED] enoxaparin (LOVENOX) injection  40 mg Subcutaneous Q24H  . [DISCONTINUED] fentaNYL  100 mcg Transdermal Q72H  . [DISCONTINUED] potassium chloride  40 mEq Oral Q4H    Assessment: 57 yo F to start on heparin for possible NSTEMI.   No bleeding noted.  Heparin level is on target.    Goal  of Therapy:  Heparin level 0.3-0.7 units/ml Monitor platelets by anticoagulation protocol: Yes   Plan: Continue Heparin at current rate. Monitor level and CBC per protocol  Valrie Hart A 11/16/2012,11:28 AM

## 2012-11-16 NOTE — Progress Notes (Signed)
Critical troponin 2.58 called to MD; no new orders received at this time

## 2012-11-16 NOTE — Progress Notes (Signed)
picc line ok to use per xray . Reported to cindy phillips rn who would let pam tate rn know iit  was ok to use.

## 2012-11-16 NOTE — Progress Notes (Signed)
Pt is oriented to place at this time, but appears confused otherwise. States that her left arm hurts from " people drilling in it." No documentation of any procedure on that arm. IV intact, no signs of phlebitis or infiltration. Pt given PRN morphine.

## 2012-11-16 NOTE — Progress Notes (Signed)
Critical troponin 1.81 called to MD. New orders received.

## 2012-11-16 NOTE — Progress Notes (Signed)
Noted xray report stated picc with kink at tip of picc. Notified [place] iv team verified with linda alford that all i needed to do was power flush and re xray

## 2012-11-16 NOTE — Progress Notes (Signed)
Pt's brother, Sharlynn Seckinger, came to visit patient, and ask about how pt is doing. Pt stated to social worker yesterday, when she was oriented, that she does not want information given to brother. This was gently reiterated to pt's brother.

## 2012-11-16 NOTE — Progress Notes (Signed)
ANTICOAGULATION CONSULT NOTE - Initial Consult  Pharmacy Consult for Heparin Indication: chest pain/ACS/VFib  Allergies  Allergen Reactions  . Aspirin Rash  . Calcium-Containing Compounds Rash  . Codeine Rash  . Tagamet (Cimetidine) Rash    Patient Measurements: Height: 5\' 2"  (157.5 cm) Weight: 118 lb 6.2 oz (53.7 kg) IBW/kg (Calculated) : 50.1 Heparin Dosing Weight: 53.7kg  Vital Signs: Temp: 98.9 F (37.2 C) (03/12 2000) Temp src: Oral (03/12 2000) BP: 155/74 mmHg (03/13 0000) Pulse Rate: 51 (03/13 0000)  Labs:  Recent Labs  11/14/12 2136 11/15/12 0518 11/15/12 1608 11/15/12 2142 11/15/12 2345  HGB 12.8  --   --  13.1  --   HCT 37.2  --   --  38.5  --   PLT 323  --   --  330  --   APTT  --  28  --   --   --   LABPROT  --  13.0  --   --   --   INR  --  0.99  --   --   --   CREATININE 0.81 0.81 0.73 0.79  --   CKTOTAL  --   --   --   --  242*  CKMB  --   --   --   --  10.1*  TROPONINI <0.30  --   --  1.81*  --     Estimated Creatinine Clearance: 62.8 ml/min (by C-G formula based on Cr of 0.79).   Medical History: Past Medical History  Diagnosis Date  . Arthritis   . Hypothyroidism   . Hypertension   . Bladder wall thickening 11/15/2012  . Enteritis 11/15/2012  . Hypokalemia 11/15/2012  . Chronic pain syndrome     Medications:  Scheduled:  . [COMPLETED] sodium chloride   Intravenous Once  . [COMPLETED] aspirin      . aspirin  325 mg Oral Once  . [COMPLETED] ciprofloxacin  400 mg Intravenous Once  . ciprofloxacin  400 mg Intravenous Q12H  . enoxaparin (LOVENOX) injection  40 mg Subcutaneous Q24H  . feeding supplement  30 mL Oral TID WC  . fentaNYL  100 mcg Transdermal Q48H  . [COMPLETED] magnesium sulfate 1 - 4 g bolus IVPB  1 g Intravenous Once  . [COMPLETED] metroNIDAZOLE  500 mg Intravenous Once  . metronidazole  500 mg Intravenous Q8H  . pantoprazole (PROTONIX) IV  40 mg Intravenous Q24H  . [COMPLETED] potassium chloride  10 mEq Intravenous  Once  . [COMPLETED] potassium chloride  10 mEq Intravenous Q1 Hr x 3  . potassium chloride  10 mEq Intravenous Q1 Hr x 5  . potassium chloride  20 mEq Oral TID  . sodium chloride  3 mL Intravenous Q12H  . [COMPLETED] spironolactone  25 mg Oral Once  . [DISCONTINUED] antiseptic oral rinse  15 mL Mouth Rinse q12n4p  . [DISCONTINUED] chlorhexidine  15 mL Mouth Rinse BID  . [DISCONTINUED] fentaNYL  100 mcg Transdermal Q72H  . [DISCONTINUED] levothyroxine  100 mcg Intravenous Daily  . [DISCONTINUED] magnesium sulfate LVP 250-500 ml  1 g Intravenous Once  . [DISCONTINUED] magnesium sulfate LVP 250-500 ml  1 g Intravenous Once  . [DISCONTINUED] potassium chloride  40 mEq Oral Q4H    Assessment: 57 yo F to start on heparin for possible NSTEMI.   No bleeding noted.   She has been on px dose Lovenox.  She received 40mg  at 1030 on 3/12.  Goal of Therapy:  Heparin level 0.3-0.7 units/ml  Monitor platelets by anticoagulation protocol: Yes   Plan:  Give 2500 units bolus x 1 Start heparin infusion at 700 units/hr Check anti-Xa level in 6 hours and daily while on heparin Continue to monitor H&H and platelets D/C sq Lovenox  [place], [facility] Riding 11/16/2012,12:58 AM

## 2012-11-16 NOTE — Progress Notes (Signed)
Attempted to give liquid KCl ordered this AM. She said she could not take it orally; asked for it to be cold. Chilled it in cup of ice; patient still stated she couldn't tolerate it; asked for it mixed with sprite and eventually stated she could not take PO KCl. Will notify MD.

## 2012-11-16 NOTE — Progress Notes (Signed)
Subjective: The patient has no complaints of chest pain or shortness of breath at rest. She continues to complain of chronic low back pain and rectal pain. She continues to have small bouts of diarrhea. No reports of black tarry stools or bright red blood per rectum.  Objective: Vital signs in last 24 hours: Filed Vitals:   11/16/12 0600 11/16/12 0615 11/16/12 0630 11/16/12 0645  BP: 138/78 123/80 122/83 136/78  Pulse: 64 58 65 58  Temp:      TempSrc:      Resp: 18 13 13 14   Height:      Weight:      SpO2: 96% 92% 93% 93%    Intake/Output Summary (Last 24 hours) at 11/16/12 0841 Last data filed at 11/16/12 0600  Gross per 24 hour  Intake 4696.12 ml  Output   1300 ml  Net 3396.12 ml    Weight change: 1.636 kg (3 lb 9.7 oz)  Physical exam: 57 year old Caucasian woman laying in bed, appearing older than stated age. No acute distress. Lungs: Clear anteriorly with decreased breath sounds in the bases. Heart: S1, S2, with several ectopic beats and borderline bradycardia. Abdomen: Positive bowel sounds, soft, mildly tender in the epigastrium and hypogastrium without laterality. Extremities: No pedal edema. Neurologic: She is alert and oriented x3. Cranial nerves II through XII are intact. Sensation of her lower extremities are intact. She is able to raise each leg against gravity 20-30.   Lab Results: Basic Metabolic Panel:  Recent Labs  16/10/96 0518  11/15/12 2142 11/16/12 0341  NA 137  < > 138 139  K <2.0*  < > 2.7* 2.6*  CL 97  < > 102 104  CO2 31  < > 28 26  GLUCOSE 106*  < > 117* 159*  BUN 6  < > 6 5*  CREATININE 0.81  < > 0.79 0.70  CALCIUM 7.9*  < > 8.0* 7.5*  MG 1.9  --  2.1  --   < > = values in this interval not displayed. Liver Function Tests:  Recent Labs  11/15/12 2142 11/16/12 0341  AST 35 31  ALT 17 14  ALKPHOS 64 51  BILITOT 0.4 0.3  PROT 6.1 5.1*  ALBUMIN 2.6* 2.2*    Recent Labs  11/14/12 2136  LIPASE 26   No results found for this  basename: AMMONIA,  in the last 72 hours CBC:  Recent Labs  11/14/12 2136 11/15/12 2142 11/16/12 0341  WBC 5.0 8.5 6.6  NEUTROABS 4.1  --   --   HGB 12.8 13.1 11.1*  HCT 37.2 38.5 32.2*  MCV 91.6 93.4 92.8  PLT 323 330 273   Cardiac Enzymes:  Recent Labs  11/14/12 2136 11/15/12 2142 11/15/12 2345 11/16/12 0341  CKTOTAL  --   --  242* 175  CKMB  --   --  10.1* 7.3*  TROPONINI <0.30 1.81*  --  2.58*   BNP: No results found for this basename: PROBNP,  in the last 72 hours D-Dimer: No results found for this basename: DDIMER,  in the last 72 hours CBG: No results found for this basename: GLUCAP,  in the last 72 hours Hemoglobin A1C:  Recent Labs  11/15/12 0518  HGBA1C 5.7*   Fasting Lipid Panel: No results found for this basename: CHOL, HDL, LDLCALC, TRIG, CHOLHDL, LDLDIRECT,  in the last 72 hours Thyroid Function Tests:  Recent Labs  11/15/12 0352  TSH 14.482*  FREET4 0.83   Anemia Panel:  Recent  Labs  11/15/12 0352  VITAMINB12 507   Coagulation:  Recent Labs  11/15/12 0518  LABPROT 13.0  INR 0.99   Urine Drug Screen: Drugs of Abuse  No results found for this basename: labopia, cocainscrnur, labbenz, amphetmu, thcu, labbarb    Alcohol Level: No results found for this basename: ETH,  in the last 72 hours Urinalysis:  Recent Labs  11/14/12 2155  COLORURINE YELLOW  LABSPEC 1.025  PHURINE 6.0  GLUCOSEU NEGATIVE  HGBUR LARGE*  BILIRUBINUR MODERATE*  KETONESUR 15*  PROTEINUR 100*  UROBILINOGEN 0.2  NITRITE NEGATIVE  LEUKOCYTESUR NEGATIVE   Misc. Labs:   Micro: Recent Results (from the past 240 hour(s))  MRSA PCR SCREENING     Status: None   Collection Time    11/15/12  4:14 AM      Result Value Range Status   MRSA by PCR NEGATIVE  NEGATIVE Final   Comment:            The GeneXpert MRSA Assay (FDA     approved for NASAL specimens     only), is one component of a     comprehensive MRSA colonization     surveillance program. It  is not     intended to diagnose MRSA     infection nor to guide or     monitor treatment for     MRSA infections.    Studies/Results: Dg Chest Portable 1 View  11/14/2012  *RADIOLOGY REPORT*  Clinical Data: Abdominal pain, altered mental status, history hypertension, former smoker  PORTABLE CHEST - 1 VIEW  Comparison: Portable exam 2145 hours compared to 04/17/2012  Findings: Lordotic positioning. Right subclavian Port-A-Cath with tip projecting over proximal SVC. Normal heart size, mediastinal contours, and pulmonary vascularity. Emphysematous and minimal bronchitic changes question COPD. No acute infiltrate, pleural effusion or pneumothorax. Probable right nipple shadow. No pleural effusion or pneumothorax.  IMPRESSION: Question COPD. No acute abnormalities.   Original Report Authenticated By: Ulyses Southward, M.D.    Ct Cta Abd/pel W/cm &/or W/o Cm  11/15/2012  *RADIOLOGY REPORT*  Clinical Data: Left-sided abdominal pain  CT ANGIOGRAPHY ABDOMEN AND PELVIS WITH CONTRAST AND WITHOUT CONTRAST  Contrast:  100 ml Omnipaque three fifty  Comparison: None.  Findings: Lung bases are predominately clear with mild areas of scarring along the periphery posteriorly.  Heart size within normal limits.  There is scattered atherosclerotic disease of the aorta and branch vessels including advanced atherosclerotic plaque of the aorta at the level of the IMA origin which results in luminal narrowing of 50%.  Poststenotic ectasia at 1.8 cm.  The aorta measures 1.5 cm above the level of the stenosis.  The celiac axis, SMA, single renal arteries, and IMA remain patent. There is advanced atherosclerotic disease of the bilateral common iliac arteries without aneurysmal dilatation.  Iliac branch vessels remain patent.  Organ evaluation is limited in the arterial and delayed phases. Allowing for this, low attenuation of the liver suggests fatty infiltration.  Unremarkable spleen, pancreas, adrenal glands.  Absent gallbladder.  Mild  biliary ductal dilatation is nonspecific.  Symmetric renal enhancement.  No hydronephrosis or hydroureter.  No CT evidence for colitis.  Mild colonic diverticulosis.  There are several thickened small bowel loops with mucosal hyperenhancement (see series 4 image 118 as index).  There is mesenteric fat stranding and intraperitoneal fluid within the abdomen pelvis.  No free intraperitoneal air.  No lymphadenopathy.  Bladder wall thickening along the anterior and lateral margins, somewhat nodular on the right as  seen on coronal image 69. Relative sparing posteriorly.  No acute osseous finding.  IMPRESSION: Advanced atherosclerotic disease of the aorta as above. Branch vessels remain patent.  Thickened loops of small bowel with mucosal hyperenhancement.  This is in keeping with a nonspecific enteritis (infectious, ischemic, and inflammatory considerations).  There is free intraperitoneal fluid which may be reactive.  Hepatic steatosis.  Enhancing bladder wall, somewhat nodular on the right.  Recommend urinalysis and cystoscopy correlation.   Original Report Authenticated By: Jearld Lesch, M.D.     Medications:  Scheduled: . ciprofloxacin  400 mg Intravenous Q12H  . feeding supplement  30 mL Oral TID WC  . fentaNYL  100 mcg Transdermal Q48H  . metronidazole  500 mg Intravenous Q8H  . pantoprazole (PROTONIX) IV  40 mg Intravenous Q24H  . potassium chloride  10 mEq Intravenous Q1 Hr x 4  . potassium chloride  40 mEq Oral BID  . potassium chloride  20 mEq Oral TID  . sodium chloride  3 mL Intravenous Q12H   Continuous: . 0.9 % NaCl with KCl 40 mEq / L Stopped (11/16/12 0823)  . amiodarone (NEXTERONE PREMIX) 360 mg/200 mL dextrose 60 mg/hr (11/16/12 0745)   And  . amiodarone (NEXTERONE PREMIX) 360 mg/200 mL dextrose    . heparin 700 Units/hr (11/16/12 0600)   RUE:[id number], morphine injection, ondansetron (ZOFRAN) IV, traZODone  Assessment: Principal Problem:   Enteritis Active Problems:    Hypokalemia   Abdominal pain   Dehydration   Encephalopathy, metabolic   Thyroiditis, chronic, lymphocytic   Bladder wall thickening   Chronic pain syndrome   Hematuria, microscopic   Hypothyroidism (acquired)   Non-ST elevation MI (NSTEMI)   Non-sustained ventricular tachycardia   Non-ST elevation MI with nonsustained ventricular tachycardia. The patient ruled in with positive cardiac enzymes. She has no complaints of chest pain or shortness of breath. Her followup EKG reveals bigeminy. This may be associated with profound hypokalemia and treatment with large amounts of opiates. She was loaded with Plavix and started on an heparin drip. She was also loaded with amiodarone and started on amiodarone drip. Beta blockade is on hold due to relative bradycardia, but could possibly be started with caution. Cardiology has been consulted.  Profound hypokalemia. Improving, but not yet normalized. This is likely secondary to diarrhea, but apparently she has a history of hypokalemia as well. Her magnesium level is within normal limits. She is being treated with oral and IV potassium runs. She was given Aldactone and 1 g of IV magnesium sulfate on 11/15/2012 to assist with potassium retention. Will consider one dose of amiloride.  Acute enteritis. Per Dr. Dionicia Abler, he does not believe this is Crohn's enteritis. We'll continue Flagyl and Cipro and supportive treatment. C. difficile PCR and other stool studies are pending.  Acquired hypothyroidism. She is treated with IV Synthroid every couple weeks per her account. She has not received Synthroid in several weeks. She is on sure of the dose of Synthroid. We'll try to contact Dr. Leslie Dales for and further clarification on dosing.  Abnormal bladder wall thickening. Hematuria per urinalysis. Query UTI, but urinary nitrite negative. Continue Cipro as above just in case. She will need a referral to urology outpatient.  Chronic pain syndrome. She is on both large  doses of fentanyl transdermal and MS Contin. Currently, she is on 100 mcg of fentanyl transdermal every 48 hours, to avoid withdrawal syndrome. (Of note, she had 4-75 mcg transdermal patches on when she was admitted).  Acute encephalopathy. Etiology multifactorial including opiate overuse/abuse, hypokalemia, acute infections. Her mental status is improving.  Hyperglycemia. Her hemoglobin A1c is 5.7. This will be followed.  Plan:  1. Cardiology consult pending. 2. Continue to cycle cardiac enzymes as already ordered. Continue heparin drip for now. 3. We'll order a 2-D echocardiogram. 4. Will try to contact her primary care physician to ask about dosing of Synthroid intravenously. 5. Add gentle sliding scale NovoLog. 6. Per GI, her pain physician will be called regarding dosing of her chronic opiates. 7. Continue IV potassium runs as already ordered. We'll check a serum potassium at 4:00 today and every morning.    Total critical care time: 45 minutes.   LOS: 2 days   Akili Cuda 11/16/2012, 8:41 AM

## 2012-11-16 NOTE — Progress Notes (Signed)
Critical K 2.7 called to MD; new orders received

## 2012-11-16 NOTE — Progress Notes (Addendum)
Patient ID: [name], female   DOB: December 11, 1955, 57 y.o.   MRN: [id number] Continues to have some diarrhea, but she says is less. Stool studies are pending. Seems more alert. Appears sedated. Will try to find out correct dose of her MS contin from Dr. Vear Clock. Present getting IV potassium for K of 2.6.  Filed Vitals:   11/16/12 0600 11/16/12 0615 11/16/12 0630 11/16/12 0645  BP: 138/78 123/80 122/83 136/78  Pulse: 64 58 65 58  Temp:      TempSrc:      Resp: 18 13 13 14   Height:      Weight:      SpO2: 96% 92% 93% 93%   CMP     Component Value Date/Time   NA 139 11/16/2012 0341   K 2.6* 11/16/2012 0341   CL 104 11/16/2012 0341   CO2 26 11/16/2012 0341   GLUCOSE 159* 11/16/2012 0341   BUN 5* 11/16/2012 0341   CREATININE 0.70 11/16/2012 0341   CALCIUM 7.5* 11/16/2012 0341   PROT 5.1* 11/16/2012 0341   ALBUMIN 2.2* 11/16/2012 0341   AST 31 11/16/2012 0341   ALT 14 11/16/2012 0341   ALKPHOS 51 11/16/2012 0341   BILITOT 0.3 11/16/2012 0341   GFRNONAA >90 11/16/2012 0341   GFRAA >90 11/16/2012 0341   GI attending note; Cardiology note appreciated. Patient has elevated troponin levels and felt to have NSTEMI and she also has NSVT. As far as patient's GI symptoms are concerned she is feeling better. She denies abdominal pain. Her diarrhea has slowed down. She believes she had one stool this morning. Stool C. difficile by PCR is negative. Other studies are pending. Patient tells me that she was diagnosed with Crohn's disease either at [facility] or [facility]. Yesterday she provided different information. She however has not been on any therapy. We will try to obtain these records. I do not believe that acute symptoms are due to IBD flare. I did contact Dr. Thyra Breed office but could not get past the operator. Recommendations. If she able to tolerate liquids without switched to oral Cipro and metronidazole. Will request GI records from [facility] and [facility]. We'll also check pre-albumin.
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
## 2012-11-17 DIAGNOSIS — D649 Anemia, unspecified: Secondary | ICD-10-CM | POA: Diagnosis not present

## 2012-11-17 LAB — CBC
MCHC: 34.9 g/dL (ref 30.0–36.0)
Platelets: 159 10*3/uL (ref 150–400)
RDW: 15.5 % (ref 11.5–15.5)

## 2012-11-17 LAB — LIPID PANEL
Cholesterol: 64 mg/dL (ref 0–200)
VLDL: 10 mg/dL (ref 0–40)

## 2012-11-17 LAB — GLUCOSE, CAPILLARY: Glucose-Capillary: 85 mg/dL (ref 70–99)

## 2012-11-17 LAB — FOLATE: Folate: 5.6 ng/mL

## 2012-11-17 LAB — URINE CULTURE: Colony Count: 50000

## 2012-11-17 LAB — BASIC METABOLIC PANEL
GFR calc Af Amer: >90 mL/min (ref >90)
GFR calc non Af Amer: >90 mL/min (ref >90)
Potassium: 3.3 mEq/L — ABNORMAL LOW (ref 3.5–5.1)
Sodium: 136 mEq/L (ref 135–145)

## 2012-11-17 LAB — IRON AND TIBC
Iron: 53 ug/dL (ref 42–135)
Saturation Ratios: 28 % (ref 20–55)
TIBC: 187 ug/dL — ABNORMAL LOW (ref 250–470)
UIBC: 134 ug/dL (ref 125–400)

## 2012-11-17 LAB — FERRITIN: Ferritin: 40 ng/mL (ref 10–291)

## 2012-11-17 LAB — HEPARIN LEVEL (UNFRACTIONATED): Heparin Unfractionated: 0.19 IU/mL — ABNORMAL LOW (ref 0.30–0.70)

## 2012-11-17 LAB — PREALBUMIN: Prealbumin: 10.5 mg/dL — ABNORMAL LOW (ref 17.0–34.0)

## 2012-11-17 MED ORDER — LEVOTHYROXINE SODIUM 100 MCG IV SOLR
50.0000 ug | Freq: Every day | INTRAVENOUS | Status: DC
  Administered 2012-11-17 – 2012-11-19 (×3): 50 ug via INTRAVENOUS
  Filled 2012-11-17 (×4): qty 5

## 2012-11-17 MED ORDER — METRONIDAZOLE 500 MG PO TABS
250.0000 mg | ORAL_TABLET | Freq: Three times a day (TID) | ORAL | Status: DC
  Administered 2012-11-17 – 2012-11-19 (×6): 250 mg via ORAL
  Filled 2012-11-17 (×3): qty 1
  Filled 2012-11-17: qty 2
  Filled 2012-11-17 (×2): qty 1

## 2012-11-17 MED ORDER — PANTOPRAZOLE SODIUM 40 MG IV SOLR
40.0000 mg | Freq: Two times a day (BID) | INTRAVENOUS | Status: DC
  Administered 2012-11-17 – 2012-11-18 (×3): 40 mg via INTRAVENOUS
  Filled 2012-11-17 (×3): qty 40

## 2012-11-17 MED ORDER — ENSURE COMPLETE PO LIQD
237.0000 mL | Freq: Two times a day (BID) | ORAL | Status: DC
  Administered 2012-11-17 (×2): 237 mL via ORAL

## 2012-11-17 MED ORDER — AMILORIDE HCL 5 MG PO TABS
5.0000 mg | ORAL_TABLET | Freq: Once | ORAL | Status: AC
  Administered 2012-11-17: 5 mg via ORAL
  Filled 2012-11-17: qty 1

## 2012-11-17 MED ORDER — CIPROFLOXACIN HCL 250 MG PO TABS
500.0000 mg | ORAL_TABLET | Freq: Two times a day (BID) | ORAL | Status: DC
  Administered 2012-11-17 – 2012-11-19 (×4): 500 mg via ORAL
  Filled 2012-11-17 (×3): qty 1
  Filled 2012-11-17: qty 2
  Filled 2012-11-17: qty 1
  Filled 2012-11-17: qty 2

## 2012-11-17 NOTE — Clinical Social Work Note (Signed)
CSW met w patient in room to discuss possibility of ALF placement.  Previously patient had expressed interest in placement; however, today patient states that she feels well enough to return home where she lives alone.  CSW will give patient list of ALFs for her reference, but will not initiate placement process as patient does not desire placement at this time.  Santa Genera, LCSW Clinical Social Worker [phone])

## 2012-11-17 NOTE — Progress Notes (Addendum)
SUBJECTIVE:Moaning, complaints of generalized pain.  Principal Problem:   Enteritis Active Problems:   Hypokalemia   Abdominal pain   Dehydration   Encephalopathy, metabolic   Thyroiditis, chronic, lymphocytic   Bladder wall thickening   Chronic pain syndrome   Hematuria, microscopic   Hypothyroidism (acquired)   Non-ST elevation MI (NSTEMI)   Non-sustained ventricular tachycardia   Hyperglycemia   LABS: Basic Metabolic Panel:  Recent Labs  16/10/96 0518  11/15/12 2142  11/16/12 1958 11/17/12 0635  NA 137  < > 138  < > 138 136  K <2.0*  < > 2.7*  < > 2.7* 3.3*  CL 97  < > 102  < > 103 103  CO2 31  < > 28  < > 26 26  GLUCOSE 106*  < > 117*  < > 87 82  BUN 6  < > 6  < > 4* 3*  CREATININE 0.81  < > 0.79  < > 0.75 0.75  CALCIUM 7.9*  < > 8.0*  < > 7.5* 7.3*  MG 1.9  --  2.1  --   --   --   < > = values in this interval not displayed. Liver Function Tests:  Recent Labs  11/15/12 2142 11/16/12 0341  AST 35 31  ALT 17 14  ALKPHOS 64 51  BILITOT 0.4 0.3  PROT 6.1 5.1*  ALBUMIN 2.6* 2.2*    Recent Labs  11/14/12 2136  LIPASE 26   CBC:  Recent Labs  11/14/12 2136  11/16/12 0341 11/17/12 0635  WBC 5.0  < > 6.6 4.3  NEUTROABS 4.1  --   --   --   HGB 12.8  < > 11.1* 8.8*  HCT 37.2  < > 32.2* 25.2*  MCV 91.6  < > 92.8 94.7  PLT 323  < > 273 159  < > = values in this interval not displayed. Cardiac Enzymes:  Recent Labs  11/15/12 2142 11/15/12 2345 11/16/12 0341 11/16/12 1437  CKTOTAL  --  242* 175  --   CKMB  --  10.1* 7.3*  --   TROPONINI 1.81*  --  2.58* 1.36*   BNP: No components found with this basename: POCBNP,  D-Dimer: No results found for this basename: DDIMER,  in the last 72 hours Hemoglobin A1C:  Recent Labs  11/15/12 0518  HGBA1C 5.7*   Fasting Lipid Panel:  Recent Labs  11/17/12 0635  CHOL 64  HDL 44  LDLCALC 10  TRIG 52  CHOLHDL 1.5   Thyroid Function Tests:  Recent Labs  11/15/12 0352  TSH 14.482*     RADIOLOGY: Dg Chest Port 1 View  11/16/2012  *RADIOLOGY REPORT*  Clinical Data: PICC placement.  PORTABLE CHEST - 1 VIEW  Comparison: Chest 11/14/2012.  Findings: The patient has a new left PICC.  The tip of the catheter appears kinked and could be within the azygos vein.  Port-A-Cath on the right is again seen.  Lungs demonstrate minimal basilar atelectasis.  Heart size is normal.  No pneumothorax.  IMPRESSION: Tip of new left PICC is kinked and could be within the azygos vein. Lateral film could be used for confirmation.   Original Report Authenticated By: Holley Dexter, M.D.    Dg Chest Port 1v Same Day  11/16/2012  *RADIOLOGY REPORT*  Clinical Data: Confirm PICC line placement.  PORTABLE CHEST - 1 VIEW SAME DAY  Comparison: 11/16/2012  Findings: Left PICC line has been repositioned.  The tip is in the  SVC.  Right Port-A-Cath is unchanged.  There is hyperinflation of the lungs compatible with COPD. Scarring in the lung bases.  Heart is normal size.  IMPRESSION: Left PICC line tip in the SVC.  COPD/chronic changes.   Original Report Authenticated By: Charlett Nose, M.D.      PHYSICAL EXAM BP 123/71  Pulse 48  Temp(Src) 97.8 F (36.6 C) (Oral)  Resp 12  Ht 5\' 2"  (1.575 m)  Wt 118 lb 9.7 oz (53.8 kg)  BMI 21.69 kg/m2  SpO2 94% General: Well developed, well nourished, in no acute distress Head: Eyes PERRLA, No xanthomas.   Normal cephalic and atramatic  Lungs: Clear bilaterally to auscultation and percussion. Heart: HRRR S1 S2, bradycardic No MRG .  Pulses are 2+ & equal.            No carotid bruit. No JVD.  No abdominal bruits. No femoral bruits. Abdomen: Bowel sounds are positive, abdomen soft and non-tender without masses or                  Hernia's noted. Msk:  Back normal, normal gait. Normal strength and tone for age. Extremities: No clubbing, cyanosis or edema.  DP +1 Neuro: Alert and oriented X 3. Psych:  Flat affect, moaning.   TELEMETRY: Reviewed telemetry pt in  Sinus Bradycardia rates in the 50's.   ASSESSMENT AND PLAN:  1. NSTEMI: Elevation of troponin 1.81-2.58, with elevation and CK-MB, they are now trending down with this a.m troponin level of 1.36. EKG changes indicative of inferior MI  This is multifactorial in the setting of significant hypothyroidism, hypokalemia dehydration and medical noncompliance. Echocardiogram has been completed but results are pending. Make more recommendations once LE function is clear. With multiple medical issues and co-morbidities would not pursue testing for ischemia  until she is stable.  2. NSVT: Etiology most likely related to hypokalemia, and hypothyroidism. Amiodarone has been discontinued. Would not recommend placing on po amiodarone in the setting of hypothyroidism. She remains bradycardic, likely related to amiodarone load. No evidence of ectopy on review of telemetry. She is on metoprolol at home but not restarted at this time. Would not do so until heart rate is able to tolerate.  3.Hypothyroidism: Significantly elevated TSH on admission at 14.48 with thyroiditis She was given IV Synthroid in the ER. She apparently is unable to insert absorbe PO mouth Synthroid, PCP provides her with intramuscular Synthroid every 2 weeks.   4. Chronic pain: She is followed by Dr. Thyra Breed for pain medication and management. She apparently has Fentanyl patches along with oxycodone for pain control. She is complaining of overall pain. She has fentanyl patches in place.  5 . Crohns Disease: Significant diarrhea, self-reported, for the last 2 weeks prior to admission with associated abdominal pain. She is being treated for enteritis with Cipro and Flagyl. GI consult is underway.   6. Hypokalemia: This has been repleted in IV form. Her potassium level has increased to 3.3 on followup labs. She continues on by mouth replacement.   Bettey Mare. Lyman Bishop NP [facility] 11/17/2012, 8:20 AM  Patinet seen and examined.   Breathing os OK  No CP  ON exam patient is in NAD  Lungs are rel clear.  Cardiac:  RRR.  No signif mumurs.  Ext:  No edema.  I have reviewed echo  Overall LV function is normal.  There is septal hypokiensis  Anteror wall not well seen  I would continue to treat medically  With medical issues I would recomm noninvasive evaluation at first.  Plan for next week if clinically improves. [facility])  Will not order now  Depends on weekend progress.

## 2012-11-17 NOTE — Progress Notes (Signed)
ANTIBIOTIC CONSULT NOTE   Pharmacy Consult for Cipro Indication: enteritis, h/o Crohn's  Allergies  Allergen Reactions  . Aspirin Rash  . Calcium-Containing Compounds Rash  . Codeine Rash  . Tagamet (Cimetidine) Rash   Patient Measurements: Height: 5\' 2"  (157.5 cm) Weight: 118 lb 9.7 oz (53.8 kg) IBW/kg (Calculated) : 50.1  Vital Signs: Temp: 97.7 F (36.5 C) (03/14 0834) Temp src: Oral (03/14 0834) BP: 123/71 mmHg (03/14 0600) Pulse Rate: 48 (03/14 0600) Intake/Output from previous day: 03/13 0701 - 03/14 0700 In: 3160 [I.V.:1960; IV Piggyback:1200] Out: 500 [Urine:500] Intake/Output from this shift: Total I/O In: 10 [I.V.:10] Out: -   Labs:  Recent Labs  11/15/12 2142 11/16/12 0341 11/16/12 1958 11/17/12 0635  WBC 8.5 6.6  --  4.3  HGB 13.1 11.1*  --  8.8*  PLT 330 273  --  159  CREATININE 0.79 0.70 0.75 0.75   Estimated Creatinine Clearance: 62.8 ml/min (by C-G formula based on Cr of 0.75). No results found for this basename: VANCOTROUGH, Leodis Binet, VANCORANDOM, GENTTROUGH, GENTPEAK, GENTRANDOM, TOBRATROUGH, TOBRAPEAK, TOBRARND, AMIKACINPEAK, AMIKACINTROU, AMIKACIN,  in the last 72 hours   Microbiology: Recent Results (from the past 720 hour(s))  URINE CULTURE     Status: None   Collection Time    11/14/12  9:55 PM      Result Value Range Status   Specimen Description URINE, CATHETERIZED   Final   Special Requests NONE   Final   Culture  Setup Time 11/16/2012 02:37   Final   Colony Count 50,000 COLONIES/ML   Final   Culture     Final   Value: STAPHYLOCOCCUS SPECIES (COAGULASE NEGATIVE)     Note: RIFAMPIN AND GENTAMICIN SHOULD NOT BE USED AS SINGLE DRUGS FOR TREATMENT OF STAPH INFECTIONS.   Report Status 11/17/2012 FINAL   Final   Organism ID, Bacteria STAPHYLOCOCCUS SPECIES (COAGULASE NEGATIVE)   Final  MRSA PCR SCREENING     Status: None   Collection Time    11/15/12  4:14 AM      Result Value Range Status   MRSA by PCR NEGATIVE  NEGATIVE Final    Comment:            The GeneXpert MRSA Assay (FDA     approved for NASAL specimens     only), is one component of a     comprehensive MRSA colonization     surveillance program. It is not     intended to diagnose MRSA     infection nor to guide or     monitor treatment for     MRSA infections.   Medical History: Past Medical History  Diagnosis Date  . Arthritis   . Hypothyroidism   . Hypertension   . Bladder wall thickening 11/15/2012  . Enteritis 11/15/2012  . Hypokalemia 11/15/2012  . Chronic pain syndrome    Medications:  Scheduled:  . [COMPLETED] aMILoride  5 mg Oral Once  . ciprofloxacin  400 mg Intravenous Q12H  . feeding supplement  237 mL Oral BID BM  . feeding supplement  30 mL Oral TID WC  . fentaNYL  100 mcg Transdermal Q48H  . insulin aspart  0-9 Units Subcutaneous TID WC  . levothyroxine  50 mcg Intravenous Daily  . metronidazole  500 mg Intravenous Q8H  . pantoprazole (PROTONIX) IV  40 mg Intravenous Q12H  . [EXPIRED] potassium chloride  10 mEq Intravenous Q1 Hr x 4  . [COMPLETED] potassium chloride  10 mEq Intravenous Q1 Hr  x 3  . [COMPLETED] potassium chloride  10 mEq Intravenous Q1 Hr x 6  . potassium chloride  40 mEq Oral TID  . sodium chloride  10-40 mL Intracatheter Q12H  . sodium chloride  3 mL Intravenous Q12H  . [DISCONTINUED] pantoprazole (PROTONIX) IV  40 mg Intravenous Q24H  . [DISCONTINUED] potassium chloride  40 mEq Oral BID   Assessment: 57yo female with h/o Crohn's dz admitted due to confusion with n/v and abdominal pain.  Pt has good renal fxn.  Estimated Creatinine Clearance: 62.8 ml/min (by C-G formula based on Cr of 0.75).  Pt started on Flagyl and Cipro empirically.  Urine culture noted.  Specimen Description URINE, CATHETERIZED    Special Requests NONE    Culture Setup Time 11/16/2012 02:37    Colony Count 50,000 COLONIES/ML    Culture STAPHYLOCOCCUS SPECIES (COAGULASE NEGATIVE) Note: RIFAMPIN AND GENTAMICIN SHOULD NOT BE USED AS  SINGLE DRUGS FOR TREATMENT OF STAPH INFECTIONS.   Report Status 11/17/2012 FINAL    Organism ID, Bacteria STAPHYLOCOCCUS SPECIES (COAGULASE NEGATIVE)   Resulting Agency [facility]   Culture & Susceptibility    Antibiotic  Organism Organism Organism     STAPHYLOCOCCUS SPECIES     GENTAMICIN COAGULASE NEGATIVE <=0.5 SENSITIVE S Final       LEVOFLOXACIN COAGULASE NEGATIVE 0.25 SENSITIVE S Final       NITROFURANTOIN COAGULASE NEGATIVE <=16 SENSITIVE S Final       OXACILLIN COAGULASE NEGATIVE >=4 RESISTANT R Final       PENICILLIN COAGULASE NEGATIVE >=0.5 RESISTANT R Final       RIFAMPIN COAGULASE NEGATIVE <=0.5 SENSITIVE S Final       TETRACYCLINE COAGULASE NEGATIVE 2 SENSITIVE S Final       VANCOMYCIN COAGULASE NEGATIVE 1 SENSITIVE S Final       Goal of Therapy:  Eradicate infection.  Plan: Cipro 400mg  IV q12hrs Continue Flagyl Monitor labs and renal fxn Duration of therapy per MD  Valrie Hart A 11/17/2012,11:06 AM

## 2012-11-17 NOTE — Progress Notes (Signed)
Subjective; Patient denies abdominal pain. No diarrhea reported since last seen. She states appetite is better. She also denies chest pain or shortness of breath. Objective; BP 151/83  Pulse 48  Temp(Src) 97.8 F (36.6 C) (Oral)  Resp 10  Ht 5\' 2"  (1.575 m)  Wt 118 lb 9.7 oz (53.8 kg)  BMI 21.69 kg/m2  SpO2 94% Patient is very drowsy but able to answer questions. Abdomen is soft and nontender. Lab data; WBC 4.3 H&H 8.8 and 25.2 and platelet count 159K Prealbumin is 10.5(17-34). Serum sodium 136, potassium 3.3, chloride 103, CO2 26, glucose 82 BUN 33 and creatinine 0.75 Assessment; #1. Enteritis possibly an infection. History of Crohn disease recorded in the chart but are not been able to find any evidence supporting this diagnosis. No records received from [facility] or [facility] C. difficile by PCR is negative. Other studies are pending. #2. Malnutrition. Free albumin is quite low. Suspect poor intake given patient's chronic conditions and narcotic dependence. #3. Anemia. Serum B12 level was normal 2 days ago. No evidence of overt GI bleed. Will check iron studies and folate level. #4. Hypokalemia. Serum potassium responding to therapy but still low. #5. NSTEMI. Patient remains stable. #6. Chronic back pain with dependence on narcotics. #7. Hypothyroidism with inability to absorb oral medication. Recommendations; Iron studies and serum folate level. Switch to oral Cipro and metronidazole. H&H in a.m.

## 2012-11-17 NOTE — Progress Notes (Signed)
Subjective: The patient has no complaints of chest pain or shortness of breath at rest. She continues to complain of chronic low back pain and rectal pain. She reports small bowel for diarrhea, but the nursing staff did not confirm this.  Objective: Vital signs in last 24 hours: Filed Vitals:   11/17/12 0530 11/17/12 0545 11/17/12 0600 11/17/12 0834  BP: 123/72 123/76 123/71   Pulse: 56 56 48   Temp:    97.7 F (36.5 C)  TempSrc:    Oral  Resp: 9 15 12    Height:      Weight:      SpO2: 94% 95% 94%     Intake/Output Summary (Last 24 hours) at 11/17/12 0945 Last data filed at 11/17/12 0600  Gross per 24 hour  Intake 2959.97 ml  Output    500 ml  Net 2459.97 ml    Weight change:   Physical exam: 57 year old Caucasian woman laying in bed, appearing older than stated age. No acute distress. Lungs: Clear anteriorly with decreased breath sounds in the bases. Heart: S1, S2, with a soft systolic murmur and bradycardia. Abdomen: Positive bowel sounds, soft, mildly tender in the epigastrium and hypogastrium without laterality. Extremities: No pedal edema. Musculoskeletal: She has mild tenderness to her lumbosacral muscles without obvious spasm or erythema. Neurologic: She is alert and oriented x3. Cranial nerves II through XII are intact. Sensation of her lower extremities are intact. She is able to raise each leg against gravity 20-30.   Lab Results: Basic Metabolic Panel:  Recent Labs  40/98/11 0518  11/15/12 2142  11/16/12 1958 11/17/12 0635  NA 137  < > 138  < > 138 136  K <2.0*  < > 2.7*  < > 2.7* 3.3*  CL 97  < > 102  < > 103 103  CO2 31  < > 28  < > 26 26  GLUCOSE 106*  < > 117*  < > 87 82  BUN 6  < > 6  < > 4* 3*  CREATININE 0.81  < > 0.79  < > 0.75 0.75  CALCIUM 7.9*  < > 8.0*  < > 7.5* 7.3*  MG 1.9  --  2.1  --   --   --   < > = values in this interval not displayed. Liver Function Tests:  Recent Labs  11/15/12 2142 11/16/12 0341  AST 35 31  ALT 17 14   ALKPHOS 64 51  BILITOT 0.4 0.3  PROT 6.1 5.1*  ALBUMIN 2.6* 2.2*    Recent Labs  11/14/12 2136  LIPASE 26   No results found for this basename: AMMONIA,  in the last 72 hours CBC:  Recent Labs  11/14/12 2136  11/16/12 0341 11/17/12 0635  WBC 5.0  < > 6.6 4.3  NEUTROABS 4.1  --   --   --   HGB 12.8  < > 11.1* 8.8*  HCT 37.2  < > 32.2* 25.2*  MCV 91.6  < > 92.8 94.7  PLT 323  < > 273 159  < > = values in this interval not displayed. Cardiac Enzymes:  Recent Labs  11/15/12 2142 11/15/12 2345 11/16/12 0341 11/16/12 1437  CKTOTAL  --  242* 175  --   CKMB  --  10.1* 7.3*  --   TROPONINI 1.81*  --  2.58* 1.36*   BNP: No results found for this basename: PROBNP,  in the last 72 hours D-Dimer: No results found for this basename: DDIMER,  in the last 72 hours CBG:  Recent Labs  11/16/12 1331 11/16/12 1526 11/16/12 2106 11/17/12 0750  GLUCAP 91 92 96 75   Hemoglobin A1C:  Recent Labs  11/15/12 0518  HGBA1C 5.7*   Fasting Lipid Panel:  Recent Labs  11/17/12 0635  CHOL 64  HDL 44  LDLCALC 10  TRIG 52  CHOLHDL 1.5   Thyroid Function Tests:  Recent Labs  11/15/12 0352  TSH 14.482*  FREET4 0.83   Anemia Panel:  Recent Labs  11/15/12 0352  VITAMINB12 507   Coagulation:  Recent Labs  11/15/12 0518  LABPROT 13.0  INR 0.99   Urine Drug Screen: Drugs of Abuse  No results found for this basename: labopia,  cocainscrnur,  labbenz,  amphetmu,  thcu,  labbarb    Alcohol Level: No results found for this basename: ETH,  in the last 72 hours Urinalysis:  Recent Labs  11/14/12 2155  COLORURINE YELLOW  LABSPEC 1.025  PHURINE 6.0  GLUCOSEU NEGATIVE  HGBUR LARGE*  BILIRUBINUR MODERATE*  KETONESUR 15*  PROTEINUR 100*  UROBILINOGEN 0.2  NITRITE NEGATIVE  LEUKOCYTESUR NEGATIVE   Misc. Labs:   Micro: Recent Results (from the past 240 hour(s))  URINE CULTURE     Status: None   Collection Time    11/14/12  9:55 PM      Result  Value Range Status   Specimen Description URINE, CATHETERIZED   Final   Special Requests NONE   Final   Culture  Setup Time 11/16/2012 02:37   Final   Colony Count 50,000 COLONIES/ML   Final   Culture     Final   Value: STAPHYLOCOCCUS SPECIES (COAGULASE NEGATIVE)     Note: RIFAMPIN AND GENTAMICIN SHOULD NOT BE USED AS SINGLE DRUGS FOR TREATMENT OF STAPH INFECTIONS.   Report Status 11/17/2012 FINAL   Final   Organism ID, Bacteria STAPHYLOCOCCUS SPECIES (COAGULASE NEGATIVE)   Final  MRSA PCR SCREENING     Status: None   Collection Time    11/15/12  4:14 AM      Result Value Range Status   MRSA by PCR NEGATIVE  NEGATIVE Final   Comment:            The GeneXpert MRSA Assay (FDA     approved for NASAL specimens     only), is one component of a     comprehensive MRSA colonization     surveillance program. It is not     intended to diagnose MRSA     infection nor to guide or     monitor treatment for     MRSA infections.    Studies/Results: Dg Chest Port 1 View  11/16/2012  *RADIOLOGY REPORT*  Clinical Data: PICC placement.  PORTABLE CHEST - 1 VIEW  Comparison: Chest 11/14/2012.  Findings: The patient has a new left PICC.  The tip of the catheter appears kinked and could be within the azygos vein.  Port-A-Cath on the right is again seen.  Lungs demonstrate minimal basilar atelectasis.  Heart size is normal.  No pneumothorax.  IMPRESSION: Tip of new left PICC is kinked and could be within the azygos vein. Lateral film could be used for confirmation.   Original Report Authenticated By: Holley Dexter, M.D.    Dg Chest Port 1v Same Day  11/16/2012  *RADIOLOGY REPORT*  Clinical Data: Confirm PICC line placement.  PORTABLE CHEST - 1 VIEW SAME DAY  Comparison: 11/16/2012  Findings: Left PICC line has been repositioned.  The  tip is in the SVC.  Right Port-A-Cath is unchanged.  There is hyperinflation of the lungs compatible with COPD. Scarring in the lung bases.  Heart is normal size.   IMPRESSION: Left PICC line tip in the SVC.  COPD/chronic changes.   Original Report Authenticated By: Charlett Nose, M.D.     Medications:  Scheduled: . aMILoride  5 mg Oral Once  . ciprofloxacin  400 mg Intravenous Q12H  . feeding supplement  237 mL Oral BID BM  . feeding supplement  30 mL Oral TID WC  . fentaNYL  100 mcg Transdermal Q48H  . insulin aspart  0-9 Units Subcutaneous TID WC  . levothyroxine  50 mcg Intravenous Daily  . metronidazole  500 mg Intravenous Q8H  . pantoprazole (PROTONIX) IV  40 mg Intravenous Q12H  . potassium chloride  40 mEq Oral TID  . sodium chloride  10-40 mL Intracatheter Q12H  . sodium chloride  3 mL Intravenous Q12H   Continuous: . 0.9 % NaCl with KCl 40 mEq / L 75 mL/hr at 11/17/12 0600   ZOX:[id number], morphine injection, ondansetron (ZOFRAN) IV, sodium chloride, traZODone  Assessment: Principal Problem:   Enteritis Active Problems:   Hypokalemia   Abdominal pain   Dehydration   Encephalopathy, metabolic   Thyroiditis, chronic, lymphocytic   Bladder wall thickening   Chronic pain syndrome   Hematuria, microscopic   Hypothyroidism (acquired)   Non-ST elevation MI (NSTEMI)   Non-sustained ventricular tachycardia   Hyperglycemia   UTI (urinary tract infection)   Non-ST elevation MI with nonsustained ventricular tachycardia. The patient ruled in with positive cardiac enzymes. She has no complaints of chest pain or shortness of breath. Her followup EKG revealed bigeminy. This may be associated with profound hypokalemia and treatment with large amounts of opiates. She was loaded with Plavix and was started on an heparin drip. She was also loaded with amiodarone and started on amiodarone drip. Beta blockade was on hold due to relative bradycardia. Cardiology consulted and assessment/recommendations noted and appreciated. Official results of echocardiogram pending. I have stopped the amiodarone and heparin drips due to relative bradycardia and a  decrease in her hemoglobin respectively. Followup EKG today reveals resolution of bigeminy and QT prolongation.  Profound hypokalemia. Improving, but not yet normalized. This is likely secondary to diarrhea, but apparently she has a history of hypokalemia as well. Her magnesium level is within normal limits. She is being treated with oral and IV potassium runs. She was given Aldactone and 1 g of IV magnesium sulfate x1 on 11/15/2012 to assist with potassium retention. Will give 1 dose of amiloride today.  Acute enteritis. Per Dr. Karilyn Cota, he does not believe this is Crohn's enteritis. We'll continue Flagyl and Cipro and supportive treatment. C. difficile PCR and other stool studies are pending. Her diarrhea has nearly stopped.  Anemia. Her hemoglobin has fallen several grams. Not sure if this is related to Plavix and heparin she received. Also may be in part hemodilutional.  Acquired hypothyroidism. She is treated with IV Synthroid every couple weeks per her account. She has not received Synthroid in several weeks. She is not sure of the dose of Synthroid. We'll try to contact Dr. Leslie Dales for and further clarification on dosing. In the meantime, we'll start IV Synthroid at 50 mcg daily.  Abnormal bladder wall thickening/UTI. Hematuria per urinalysis. Her urine culture has grown out 50,000 colonies of coagulase-negative staph, sensitive to Levaquin so we'll continue Cipro. She may need a referral to urology outpatient.  Chronic pain syndrome. She is on both large doses of fentanyl transdermal and MS Contin. Currently, she is on 100 mcg of fentanyl transdermal every 48 hours, to avoid withdrawal syndrome. (Of note, she had 4-75 mcg transdermal patches on when she was admitted).   Acute encephalopathy. Etiology multifactorial including opiate overuse/abuse, hypokalemia, acute infections. Her mental status is improving.  Hyperglycemia. Her hemoglobin A1c is 5.7. This will be followed. Continue sliding  scale NovoLog.  Plan:  1. Check the results of 2-D echocardiogram pending. 2. Change protonix to twice a day. 3. Hemoccult stools. Check the results of stool studies pending. 4. Start IV Synthroid at 50 mcg daily until I can find out the patient's usual regimen. 5. Given 1 dose of amiloride. Continue potassium chloride repletion. 5. Transfer to telemetry and consult physical therapy.     Total critical care time: 45 minutes.   LOS: 3 days   FISHER,DENISE 11/17/2012, 9:45 AM

## 2012-11-17 NOTE — Evaluation (Signed)
Physical Therapy Evaluation Patient Details Name: [name] MRN: [id number] DOB: 07-14-1956 Today's Date: 11/17/2012 Time: 2952-8413 PT Time Calculation (min): 34 min  PT Assessment / Plan / Recommendation Clinical Impression  Pt appears to be slightly lethargic.  Gt is slightly unsteady but pt is mod I in all activities.    PT Assessment  Patient needs continued PT services    Follow Up Recommendations  Home health PT    Does the patient have the potential to tolerate intense rehabilitation    N/A  Barriers to Discharge  none      Equipment Recommendations    none   Recommendations for Other Services OT consult   Frequency Min 3X/week    Precautions / Restrictions Precautions Precautions: None Restrictions Weight Bearing Restrictions: No   Pertinent Vitals/Pain 0/10      Mobility  Bed Mobility Bed Mobility: Supine to Sit Supine to Sit: 6: Modified independent (Device/Increase time) Transfers Transfers: Sit to Stand Sit to Stand: 7: Independent Ambulation/Gait Ambulation/Gait Assistance: 5: Supervision Ambulation Distance (Feet): 200 Feet Assistive device:  (Pt holding onto IV pole) Ambulation/Gait Assistance Details: Pt slightly unsteady but had no loss of balance.  Advised pt to begin with her walker at home until she gained strength then progress to cane and then wean off any assistive device. Gait Pattern: Within Functional Limits Gait velocity: slow Stairs: No    Exercises General Exercises - Lower Extremity Long Arc Quad: Both;Seated;10 reps Hip ABduction/ADduction: Both;10 reps;Seated Straight Leg Raises: Both;10 reps   PT Diagnosis: Generalized weakness  PT Problem List: Decreased strength;Decreased balance PT Treatment Interventions: Therapeutic activities   PT Goals Acute Rehab PT Goals PT Goal Formulation: With patient Time For Goal Achievement: 11/21/12 Potential to Achieve Goals: Good Pt will go Supine/Side to Sit: Independently PT  Goal: Supine/Side to Sit - Progress: Goal set today Pt will go Sit to Supine/Side: Independently PT Goal: Sit to Supine/Side - Progress: Goal set today Pt will Ambulate: >150 feet;with modified independence;with least restrictive assistive device PT Goal: Ambulate - Progress: Goal set today  Visit Information  Last PT Received On: 11/17/12    Subjective Data  Subjective: Pt states she has a cane and a walker but normally does not use any assistive device to ambulate  Patient Stated Goal: to go home   Prior Functioning  Home Living Lives With: Alone Type of Home: House Home Access: Level entry Home Layout: One level Bathroom Shower/Tub: Engineer, manufacturing systems: Standard Home Adaptive Equipment: Environmental consultant - rolling;Straight cane Prior Function Level of Independence: Independent Able to Take Stairs?: Yes Driving: Yes Vocation: Unemployed Communication Communication: No difficulties    Cognition  Cognition Overall Cognitive Status: Appears within functional limits for tasks assessed/performed Arousal/Alertness: Awake/alert Behavior During Session: Lethargic    Extremity/Trunk Assessment Right Lower Extremity Assessment RLE ROM/Strength/Tone: [facility] for tasks assessed Left Lower Extremity Assessment LLE ROM/Strength/Tone: [facility] for tasks assessed   Balance    End of Session PT - End of Session Equipment Utilized During Treatment: Gait belt;Oxygen Activity Tolerance: Patient tolerated treatment well Patient left: in chair;with chair alarm set;with call bell/phone within reach  GP     RUSSELL,CINDY 11/17/2012, 12:14 PM

## 2012-11-17 NOTE — Progress Notes (Signed)
Pt stable for transfer out of ICU to floor. Report called and given to [facility], Charity fundraiser.

## 2012-11-17 NOTE — Care Management Note (Unsigned)
    Page 1 of 1   11/17/2012     3:48:35 PM   CARE MANAGEMENT NOTE 11/17/2012  Patient:  [name], [name]   Account Number:  [id number]  Date Initiated:  11/17/2012  Documentation initiated by:  Rosemary Holms  Subjective/Objective Assessment:   Pt admitted from home where she lives alone. Per PT, HHPT recommended and pt is in agreement. Pt chose [facility].     Action/Plan:   Anticipated DC Date:  11/18/2012   Anticipated DC Plan:  HOME W HOME HEALTH SERVICES      DC Planning Services  CM consult      Choice offered to / List presented to:          [facility] arranged  HH-2 PT      [facility] agency  [facility]   Status of service:  In process, will continue to follow Medicare Important Message given?  YES (If response is "NO", the following Medicare IM given date fields will be blank) Date Medicare IM given:  11/17/2012 Date Additional Medicare IM given:    Discharge Disposition:    Per UR Regulation:    If discussed at Long Length of Stay Meetings, dates discussed:    Comments:  11/17/12 Rosemary Holms RN BSN CM [phone]

## 2012-11-18 LAB — CBC
HCT: 25.5 % — ABNORMAL LOW (ref 36.0–46.0)
Hemoglobin: 8.7 g/dL — ABNORMAL LOW (ref 12.0–15.0)
MCHC: 34.1 g/dL (ref 30.0–36.0)
RBC: 2.67 MIL/uL — ABNORMAL LOW (ref 3.87–5.11)

## 2012-11-18 LAB — GLUCOSE, CAPILLARY: Glucose-Capillary: 114 mg/dL — ABNORMAL HIGH (ref 70–99)

## 2012-11-18 LAB — BASIC METABOLIC PANEL
BUN: 3 mg/dL — ABNORMAL LOW (ref 6–23)
CO2: 24 mEq/L (ref 19–32)
GFR calc non Af Amer: >90 mL/min (ref >90)
Glucose, Bld: 80 mg/dL (ref 70–99)
Potassium: 3.9 mEq/L (ref 3.5–5.1)
Sodium: 138 mEq/L (ref 135–145)

## 2012-11-18 LAB — CLOSTRIDIUM DIFFICILE BY PCR: Toxigenic C. Difficile by PCR: NEGATIVE

## 2012-11-18 MED ORDER — PANTOPRAZOLE SODIUM 40 MG PO TBEC
40.0000 mg | DELAYED_RELEASE_TABLET | Freq: Two times a day (BID) | ORAL | Status: DC
  Administered 2012-11-18 – 2012-11-19 (×2): 40 mg via ORAL
  Filled 2012-11-18 (×2): qty 1

## 2012-11-18 NOTE — Progress Notes (Signed)
Pt up to chair with minimal assist from NT. Pt tolerated well. Currently on 3LPM of O2 via [place]. No c/o SOB. Respiration regular and nonlabored. Will continue to encourage patient to get OOB and ambulate.

## 2012-11-18 NOTE — Progress Notes (Signed)
Subjective; Patient states he had lot of back pain last night and was unable to sleep. She denies abdominal pain or diarrhea. She does not have a good appetite. She did eat some of her liquid breakfast. She does not believe that she had a heart attack as she's been told. Patient does not remember the name of the physician will porter she had Crohn's disease. She also does not remember Medications she was given to treat this condition. Objective; BP 125/69  Pulse 68  Temp(Src) 98.7 F (37.1 C) (Oral)  Resp 16  Ht 5\' 2"  (1.575 m)  Wt 126 lb 3.2 oz (57.244 kg)  BMI 23.08 kg/m2  SpO2 96% Patient is awake and no acute distress. Abdomen is symmetrical. Bowel sounds are normal. Abdomen is soft and nontender without organomegaly or masses. Lab data; WBC 4.3, H&H 8.7 and 25.5 and platelet count is 191K MCV 95.5. Serum sodium 138, potassium 3.9, chloride 107, CO2 24, glucose 80, BUN 3, creatinine 0.71. Serum iron 53, TIBC 187 and saturation 28%. Serum ferritin is 40. Folate level is 5.6; B12 level 507. Records from [facility] reviewed. Assessment; #1. Enteritis. Daily symptoms have resolved. She is now on oral Cipro and metronidazole. C. difficile by PCR was negative. Other studies have not been done as stool not obtained. Records from [facility] reviewed. These are from 1998 indicating that she had irritable bowel syndrome. She had normal flexible sigmoidoscopy at the time. Still waiting for records from [facility]. #2. Anemia. No evidence of overt GI bleed. B12 and folate levels are normal. Similarly iron studies are normal. Speck she has anemia of chronic disease. 2 Hemoccult is pending. #3. Other problems as before. Recommendations; Advance diet.

## 2012-11-18 NOTE — Progress Notes (Signed)
ANTIBIOTIC CONSULT NOTE   Pharmacy Consult for Cipro Indication: enteritis, h/o Crohn's  Allergies  Allergen Reactions  . Aspirin Rash  . Calcium-Containing Compounds Rash  . Codeine Rash  . Tagamet (Cimetidine) Rash   Patient Measurements: Height: 5\' 2"  (157.5 cm) Weight: 126 lb 3.2 oz (57.244 kg) IBW/kg (Calculated) : 50.1  Vital Signs: Temp: 98.7 F (37.1 C) (03/15 0530) Temp src: Oral (03/15 0530) BP: 125/69 mmHg (03/15 0530) Pulse Rate: 68 (03/15 0530) Intake/Output from previous day: 03/14 0701 - 03/15 0700 In: 1309.6 [I.V.:1309.6] Out: -  Intake/Output from this shift: Total I/O In: 240 [P.O.:240] Out: -   Labs:  Recent Labs  11/16/12 0341 11/16/12 1958 11/17/12 0635 11/18/12 0527  WBC 6.6  --  4.3 4.3  HGB 11.1*  --  8.8* 8.7*  PLT 273  --  159 191  CREATININE 0.70 0.75 0.75 0.71   Estimated Creatinine Clearance: 62.8 ml/min (by C-G formula based on Cr of 0.71). No results found for this basename: VANCOTROUGH, Leodis Binet, VANCORANDOM, GENTTROUGH, GENTPEAK, GENTRANDOM, TOBRATROUGH, TOBRAPEAK, TOBRARND, AMIKACINPEAK, AMIKACINTROU, AMIKACIN,  in the last 72 hours   Microbiology: Recent Results (from the past 720 hour(s))  URINE CULTURE     Status: None   Collection Time    11/14/12  9:55 PM      Result Value Range Status   Specimen Description URINE, CATHETERIZED   Final   Special Requests NONE   Final   Culture  Setup Time 11/16/2012 02:37   Final   Colony Count 50,000 COLONIES/ML   Final   Culture     Final   Value: STAPHYLOCOCCUS SPECIES (COAGULASE NEGATIVE)     Note: RIFAMPIN AND GENTAMICIN SHOULD NOT BE USED AS SINGLE DRUGS FOR TREATMENT OF STAPH INFECTIONS.   Report Status 11/17/2012 FINAL   Final   Organism ID, Bacteria STAPHYLOCOCCUS SPECIES (COAGULASE NEGATIVE)   Final  MRSA PCR SCREENING     Status: None   Collection Time    11/15/12  4:14 AM      Result Value Range Status   MRSA by PCR NEGATIVE  NEGATIVE Final   Comment:          The GeneXpert MRSA Assay (FDA     approved for NASAL specimens     only), is one component of a     comprehensive MRSA colonization     surveillance program. It is not     intended to diagnose MRSA     infection nor to guide or     monitor treatment for     MRSA infections.   Medical History: Past Medical History  Diagnosis Date  . Arthritis   . Hypothyroidism   . Hypertension   . Bladder wall thickening 11/15/2012  . Enteritis 11/15/2012  . Hypokalemia 11/15/2012  . Chronic pain syndrome    Medications:  Scheduled:  . ciprofloxacin  500 mg Oral BID  . feeding supplement  237 mL Oral BID BM  . feeding supplement  30 mL Oral TID WC  . fentaNYL  100 mcg Transdermal Q48H  . insulin aspart  0-9 Units Subcutaneous TID WC  . levothyroxine  50 mcg Intravenous Daily  . metroNIDAZOLE  250 mg Oral Q8H  . pantoprazole (PROTONIX) IV  40 mg Intravenous Q12H  . potassium chloride  40 mEq Oral TID  . sodium chloride  10-40 mL Intracatheter Q12H  . sodium chloride  3 mL Intravenous Q12H  . [DISCONTINUED] ciprofloxacin  400 mg Intravenous Q12H  . [  DISCONTINUED] metronidazole  500 mg Intravenous Q8H   Assessment: 57yo female with h/o Crohn's dz admitted due to confusion with n/v and abdominal pain.  Pt has good renal fxn.  Estimated Creatinine Clearance: 62.8 ml/min (by C-G formula based on Cr of 0.71).  Pt started on Flagyl and Cipro empirically.   Goal of Therapy:  Eradicate infection.  Plan: Dose stable, sign off.   Mady Gemma 11/18/2012,12:50 PM

## 2012-11-18 NOTE — Progress Notes (Signed)
Subjective: The patient has no complaints of chest pain or shortness of breath at rest. She continues to complain of chronic low back pain and rectal pain. She does not report any further diarrhea, abdominal pain or vomiting.  Objective: Vital signs in last 24 hours: Filed Vitals:   11/17/12 1300 11/17/12 2051 11/18/12 0457 11/18/12 0530  BP: 151/83 115/74  125/69  Pulse:  65  68  Temp:  98 F (36.7 C)  98.7 F (37.1 C)  TempSrc:  Oral  Oral  Resp: 10 14  16   Height:      Weight:   57.244 kg (126 lb 3.2 oz)   SpO2:  99%  96%    Intake/Output Summary (Last 24 hours) at 11/18/12 1154 Last data filed at 11/18/12 0800  Gross per 24 hour  Intake 1539.58 ml  Output      0 ml  Net 1539.58 ml    Weight change:   Physical exam: 57 year old Caucasian woman laying in bed, appearing older than stated age. No acute distress. Lungs: Clear anteriorly with decreased breath sounds in the bases. Heart: S1, S2, with a soft systolic murmur and bradycardia. Abdomen: Positive bowel sounds, soft, non tender Extremities: No pedal edema. Musculoskeletal: She has mild tenderness to her lumbosacral muscles without obvious spasm or erythema. Neurologic: She is alert and oriented x3. Cranial nerves II through XII are intact. Sensation of her lower extremities are intact.    Lab Results: Basic Metabolic Panel:  Recent Labs  16/10/96 2142  11/17/12 0635 11/18/12 0527  NA 138  < > 136 138  K 2.7*  < > 3.3* 3.9  CL 102  < > 103 107  CO2 28  < > 26 24  GLUCOSE 117*  < > 82 80  BUN 6  < > 3* 3*  CREATININE 0.79  < > 0.75 0.71  CALCIUM 8.0*  < > 7.3* 7.4*  MG 2.1  --   --   --   < > = values in this interval not displayed. Liver Function Tests:  Recent Labs  11/15/12 2142 11/16/12 0341  AST 35 31  ALT 17 14  ALKPHOS 64 51  BILITOT 0.4 0.3  PROT 6.1 5.1*  ALBUMIN 2.6* 2.2*   No results found for this basename: LIPASE, AMYLASE,  in the last 72 hours No results found for this  basename: AMMONIA,  in the last 72 hours CBC:  Recent Labs  11/17/12 0635 11/18/12 0527  WBC 4.3 4.3  HGB 8.8* 8.7*  HCT 25.2* 25.5*  MCV 94.7 95.5  PLT 159 191   Cardiac Enzymes:  Recent Labs  11/15/12 2142 11/15/12 2345 11/16/12 0341 11/16/12 1437  CKTOTAL  --  242* 175  --   CKMB  --  10.1* 7.3*  --   TROPONINI 1.81*  --  2.58* 1.36*   BNP: No results found for this basename: PROBNP,  in the last 72 hours D-Dimer: No results found for this basename: DDIMER,  in the last 72 hours CBG:  Recent Labs  11/16/12 2106 11/17/12 0750 11/17/12 1128 11/17/12 1629 11/17/12 2054 11/18/12 0728  GLUCAP 96 75 85 105* 86 81   Hemoglobin A1C: No results found for this basename: HGBA1C,  in the last 72 hours Fasting Lipid Panel:  Recent Labs  11/17/12 0635  CHOL 64  HDL 44  LDLCALC 10  TRIG 52  CHOLHDL 1.5   Thyroid Function Tests: No results found for this basename: TSH, T4TOTAL, FREET4, T3FREE, THYROIDAB,  in the last 72 hours Anemia Panel:  Recent Labs  11/17/12 0635  FOLATE 5.6  FERRITIN 40  TIBC 187*  IRON 53   Coagulation: No results found for this basename: LABPROT, INR,  in the last 72 hours Urine Drug Screen: Drugs of Abuse  No results found for this basename: labopia,  cocainscrnur,  labbenz,  amphetmu,  thcu,  labbarb    Alcohol Level: No results found for this basename: ETH,  in the last 72 hours Urinalysis: No results found for this basename: COLORURINE, APPERANCEUR, LABSPEC, PHURINE, GLUCOSEU, HGBUR, BILIRUBINUR, KETONESUR, PROTEINUR, UROBILINOGEN, NITRITE, LEUKOCYTESUR,  in the last 72 hours Misc. Labs:   Micro: Recent Results (from the past 240 hour(s))  URINE CULTURE     Status: None   Collection Time    11/14/12  9:55 PM      Result Value Range Status   Specimen Description URINE, CATHETERIZED   Final   Special Requests NONE   Final   Culture  Setup Time 11/16/2012 02:37   Final   Colony Count 50,000 COLONIES/ML   Final    Culture     Final   Value: STAPHYLOCOCCUS SPECIES (COAGULASE NEGATIVE)     Note: RIFAMPIN AND GENTAMICIN SHOULD NOT BE USED AS SINGLE DRUGS FOR TREATMENT OF STAPH INFECTIONS.   Report Status 11/17/2012 FINAL   Final   Organism ID, Bacteria STAPHYLOCOCCUS SPECIES (COAGULASE NEGATIVE)   Final  MRSA PCR SCREENING     Status: None   Collection Time    11/15/12  4:14 AM      Result Value Range Status   MRSA by PCR NEGATIVE  NEGATIVE Final   Comment:            The GeneXpert MRSA Assay (FDA     approved for NASAL specimens     only), is one component of a     comprehensive MRSA colonization     surveillance program. It is not     intended to diagnose MRSA     infection nor to guide or     monitor treatment for     MRSA infections.    Studies/Results: Dg Chest Port 1v Same Day  11/16/2012  *RADIOLOGY REPORT*  Clinical Data: Confirm PICC line placement.  PORTABLE CHEST - 1 VIEW SAME DAY  Comparison: 11/16/2012  Findings: Left PICC line has been repositioned.  The tip is in the SVC.  Right Port-A-Cath is unchanged.  There is hyperinflation of the lungs compatible with COPD. Scarring in the lung bases.  Heart is normal size.  IMPRESSION: Left PICC line tip in the SVC.  COPD/chronic changes.   Original Report Authenticated By: Charlett Nose, M.D.     Medications:  Scheduled: . ciprofloxacin  500 mg Oral BID  . feeding supplement  237 mL Oral BID BM  . feeding supplement  30 mL Oral TID WC  . fentaNYL  100 mcg Transdermal Q48H  . insulin aspart  0-9 Units Subcutaneous TID WC  . levothyroxine  50 mcg Intravenous Daily  . metroNIDAZOLE  250 mg Oral Q8H  . pantoprazole (PROTONIX) IV  40 mg Intravenous Q12H  . potassium chloride  40 mEq Oral TID  . sodium chloride  10-40 mL Intracatheter Q12H  . sodium chloride  3 mL Intravenous Q12H   Continuous: . 0.9 % NaCl with KCl 40 mEq / L 50 mL/hr at 11/18/12 0459   RUE:[id number], morphine injection, ondansetron (ZOFRAN) IV, sodium chloride,  traZODone  Assessment: Principal Problem:  Enteritis Active Problems:   Hypokalemia   Abdominal pain   Dehydration   Encephalopathy, metabolic   Thyroiditis, chronic, lymphocytic   Bladder wall thickening   Chronic pain syndrome   Hematuria, microscopic   Hypothyroidism (acquired)   Non-ST elevation MI (NSTEMI)   Non-sustained ventricular tachycardia   Hyperglycemia   UTI (urinary tract infection)   Anemia   Non-ST elevation MI with nonsustained ventricular tachycardia. The patient ruled in with positive cardiac enzymes. She has no complaints of chest pain or shortness of breath. Her followup EKG revealed bigeminy. This may be associated with profound hypokalemia and treatment with large amounts of opiates. She was loaded with Plavix and was started on an heparin drip. She was also loaded with amiodarone and started on amiodarone drip. Beta blockade was on hold due to relative bradycardia. Cardiology consulted and assessment/recommendations noted and appreciated. I have stopped the amiodarone and heparin drips due to relative bradycardia and a decrease in her hemoglobin respectively. Septal hypokinesis noted on echo.  Further cardiology work up pending.  Profound hypokalemia. Resolved. This is likely secondary to diarrhea, but apparently she has a history of hypokalemia as well. Her magnesium level is within normal limits.  Acute enteritis. Per Dr. Karilyn Cota, he does not believe this is Crohn's enteritis. We'll continue Flagyl and Cipro and supportive treatment. C. difficile PCR and other stool studies are pending. Her diarrhea has stopped.  Anemia. Her hemoglobin has fallen several grams. Not sure if this is related to Plavix and heparin she received. Also may be in part hemodilutional. Now appears to be stable without signs of bleeding.  Acquired hypothyroidism. She is treated with IV Synthroid every couple weeks per her account. She has not received Synthroid in several weeks. She is  not sure of the dose of Synthroid. We'll try to contact Dr. Leslie Dales for and further clarification on dosing. In the meantime, we'll start IV Synthroid at 50 mcg daily.  Abnormal bladder wall thickening/UTI. Hematuria per urinalysis. Her urine culture has grown out 50,000 colonies of coagulase-negative staph, sensitive to Levaquin so we'll continue Cipro. She may need a referral to urology outpatient.  Chronic pain syndrome. She is on both large doses of fentanyl transdermal and MS Contin. Currently, she is on 100 mcg of fentanyl transdermal every 48 hours, to avoid withdrawal syndrome. (Of note, she had 4-75 mcg transdermal patches on when she was admitted).   Acute encephalopathy. Etiology multifactorial including opiate overuse/abuse, hypokalemia, acute infections. Her mental status is improving.  Hyperglycemia. Her hemoglobin A1c is 5.7. This will be followed. Continue sliding scale NovoLog.   Time spent: 35 mins   LOS: 4 days   Isobelle Tuckett 11/18/2012, 11:54 AM

## 2012-11-19 ENCOUNTER — Encounter (HOSPITAL_COMMUNITY): Payer: Self-pay | Admitting: Internal Medicine

## 2012-11-19 DIAGNOSIS — R001 Bradycardia, unspecified: Secondary | ICD-10-CM | POA: Diagnosis not present

## 2012-11-19 DIAGNOSIS — I498 Other specified cardiac arrhythmias: Secondary | ICD-10-CM

## 2012-11-19 DIAGNOSIS — K589 Irritable bowel syndrome without diarrhea: Secondary | ICD-10-CM

## 2012-11-19 HISTORY — DX: Irritable bowel syndrome, unspecified: K58.9

## 2012-11-19 LAB — CBC
HCT: 26.6 % — ABNORMAL LOW (ref 36.0–46.0)
Hemoglobin: 9 g/dL — ABNORMAL LOW (ref 12.0–15.0)
MCV: 94.7 fL (ref 78.0–100.0)
RBC: 2.81 MIL/uL — ABNORMAL LOW (ref 3.87–5.11)
RDW: 15.2 % (ref 11.5–15.5)
WBC: 4.2 10*3/uL (ref 4.0–10.5)

## 2012-11-19 LAB — BASIC METABOLIC PANEL
BUN: 4 mg/dL — ABNORMAL LOW (ref 6–23)
CO2: 23 mEq/L (ref 19–32)
Chloride: 106 mEq/L (ref 96–112)
Creatinine, Ser: 0.71 mg/dL (ref 0.50–1.10)
GFR calc Af Amer: >90 mL/min (ref >90)
Glucose, Bld: 81 mg/dL (ref 70–99)
Potassium: 3.8 mEq/L (ref 3.5–5.1)

## 2012-11-19 LAB — GLUCOSE, CAPILLARY: Glucose-Capillary: 81 mg/dL (ref 70–99)

## 2012-11-19 MED ORDER — LEVOTHYROXINE SODIUM 100 MCG IV SOLR: INTRAVENOUS | Status: AC

## 2012-11-19 MED ORDER — MORPHINE SULFATE ER 60 MG PO TBCR: 120.0000 mg | EXTENDED_RELEASE_TABLET | Freq: Two times a day (BID) | ORAL | Status: DC

## 2012-11-19 MED ORDER — PANTOPRAZOLE SODIUM 40 MG PO TBEC: 40.0000 mg | DELAYED_RELEASE_TABLET | Freq: Every day | ORAL | Status: DC

## 2012-11-19 MED ORDER — FENTANYL 75 MCG/HR TD PT72: 2.0000 | MEDICATED_PATCH | TRANSDERMAL | Status: DC

## 2012-11-19 MED ORDER — CLOPIDOGREL BISULFATE 75 MG PO TABS: 75.0000 mg | ORAL_TABLET | Freq: Every day | ORAL | Status: AC

## 2012-11-19 MED ORDER — CIPROFLOXACIN HCL 500 MG PO TABS: 500.0000 mg | ORAL_TABLET | Freq: Two times a day (BID) | ORAL | Status: DC

## 2012-11-19 MED ORDER — POTASSIUM CHLORIDE 20 MEQ/15ML (10%) PO LIQD: 40.0000 meq | Freq: Two times a day (BID) | ORAL | Status: DC

## 2012-11-19 MED ORDER — METRONIDAZOLE 250 MG PO TABS: 250.0000 mg | ORAL_TABLET | Freq: Three times a day (TID) | ORAL | Status: DC

## 2012-11-19 NOTE — Discharge Summary (Signed)
Physician Discharge Summary  [name] ZOX:[id number] DOB: [birth year] DOA: 11/14/2012  PCP: Junious Silk, MD  Admit date: 11/14/2012 Discharge date: 11/19/2012  Time spent: Greater than 30 minutes minutes  Recommendations for Outpatient Follow-up:  1. The patient was informed that an appointment will be made for her to followup with [facility] cardiology in the next week or two. Her corrected phone number is [phone]. Consider outpatient stress test. 2. The patient will need to have her hemoglobin/hematocrit and serum potassium rechecked in one to 2 weeks. 3. Toprol-XL was discontinued for bradycardia. Reassessment with followup appointment is needed for restarting it.  4. The patient was instructed to followup with her primary endocrinologist Dr. Leslie Dales and pain management physician Dr. Vear Clock in one to 2 weeks.     Discharge Diagnoses:   1. Acute enteritis. 2. Non-ST elevation myocardial infarction. (2-D echocardiogram revealed septal hypokinesis and preserved LV function). 3. IBS per review of outside records by Dr. Karilyn Cota. The patient DOES NOT have Crohn's disease. 4. Severe hypokalemia. Serum potassium was less than 2.0 on admission and 3.8 at the time of discharge. 5. Nonsustained ventricular tachycardia. 6. Bradycardia. 7. Coagulase-negative staphylococcus urinary tract infection. 8. Bladder wall thickening and microhematuria, query secondary to UTI. Consider urology consultation outpatient if it does not resolve following treatment of UTI. 9. Hyperglycemia. Hemoglobin A1c is 5.7. 10. Anemia following brief anticoagulation. Stool Hemoccult was heme negative x1. 11. Chronic pain syndrome, on chronic fentanyl transdermal. 12. Dehydration. 13. Chronic hypothyroidism, treated with IV Synthroid. 13. Acute metabolic encephalopathy, etiology multifactorial.   Discharge Condition: Improved.  Diet recommendation: Heart healthy.  Filed Weights   11/15/12 0313  11/16/12 0500 11/18/12 0457  Weight: 53.7 kg (118 lb 6.2 oz) 53.8 kg (118 lb 9.7 oz) 57.244 kg (126 lb 3.2 oz)    History of present illness:  The patient is a 57 year old woman with a past medical history significant for chronic pain syndrome, opiate dependence, hypothyroidism requiring intravenous Synthroid, and hypertension, who presented to the emergency department on 11/15/2012 with confusion. In the emergency department, she was afebrile and hemodynamically stable. Her lab data were significant for a serum sodium of less than 2, glucose of 171, normal troponin I., normal lactic acid level, normal WBC, and a urinalysis consistent with infection. Her EKG revealed nonspecific ST-wave abnormalities and propanol QT interval. She had abdominal tenderness on exam. She was admitted for further evaluation and management.  Hospital Course:    Profound hypokalemia. The patient was given multiple intravenous runs of potassium and subsequently oral potassium when she became more alert and oriented. Potassium was added to the IV fluids. Her blood magnesium level was assessed for deficiency. It was within normal limits, but she was given 1 g of magnesium sulfate empirically anyway.. She was given 1 dose of Aldactone and one dose of amiloride for additional treatment. Following oral and IV supplementation, Aldactone, and amiloride, her serum sodium improved. She was discharged on 40 mEq of potassium chloride orally twice a day. The patient was instructed to not miss these doses. She will need to have her serum potassium reassessed in one to 2 weeks.   Non-ST elevation MI with nonsustained ventricular tachycardia. Due to to her abnormal appearing EKG, cardiac enzymes were ordered. She also had a brief run of non-ventricular tachycardia, 1 g of magnesium sulfate was given empirically. Her cardiac enzymes proved to be positive with an initial troponin I. of 1.81 followed by a troponin I of 2.58. Her total CK ranged  from  175 to 241 with a CK-MB ranging from 7.3-10.1. She was given a  Plavix loading and started on heparin drip. She was also loaded with amiodarone and started on an amiodarone drip. However due to bradycardia, it was discontinued. Toprol-XL had been discontinued on admission due to to low normal blood pressures.  A 2-D echocardiogram was ordered. It revealed preserved LV function with septal hypokinesis. Cardiology was consulted. Dr. Daleen Squibb and then Dr. Tenny Craw provided the consultations. Per their assessment, it appeared that the patient probably had an inferior myocardial infarction but this was in the setting of hypothyroidism, hypokalemia, chronic opiate therapy, and dehydration. They agreed with medical management. They recommended ordering fasting lipid profile. The patient's fasting lipid profile was within normal limits. They also recommended noninvasive stress testing when the patient was more clinically stable. However over the weekend, she improved clinically and symptomatically. She never had any complaints of chest pain or shortness of breath. She remained relatively bradycardic with a heart rate ranging in the lower 50s. She was discharged to home over the weekend, but was informed that I would discuss followup evaluation with [facility] cardiology. She was informed that either I or [place] [facility]ology would call her with a followup appointment for further outpatient evaluation. Of note, she was discharged to home on Plavix (she has an allergy to aspirin), but she was not discharged on a beta blocker because of bradycardia.     Acute enteritis. For further evaluation of abdominal tenderness and complaints of abdominal pain, a CT of her abdomen/pelvis was ordered. It revealed thickened loops of small bowel, consistent with an enteritis. She was started empirically on Flagyl and Cipro.  Gastroenterologist, Dr. Karilyn Cota was consulted. He reviewed the patient's outside records because there was a report that she  had Crohn's disease. However, per Dr. Patty Sermons review, there was no evidence of Crohn's enteritis, but there was a report that she had IBS. Stool studies were ordered during hospitalization. C. difficile PCR was negative. The other studies were pending at the time of discharge. She was discharged to home 4 days of Cipro and Flagyl.    Anemia. The patient's hemoglobin was 13.1 on admission, but this may have been a reflection of hemoconcentration from dehydration. Following Plavix and heparin, her hemoglobin fell to 11.1 and then to a nadir of 8.7. One stool was Hemoccult negative. Protonix was started empirically. Her hemoglobin stabilized at 9.0 prior to discharge. The decrease may have been secondary to the hemodilution from IV fluids. She was discharged on Plavix, but was instructed to stop it if she saw bright red blood per rectum, melena, or any other unusual bleeding. She was continued on Protonix following discharge. She will followup with Dr. Karilyn Cota accordingly.    Acquired hypothyroidism. She is treated with IV Synthroid twice weekly by Dr. Leslie Dales per her account. Her usual IV dosing was not confirmed during the hospitalization, but she did receive 50 mcg of IV Synthroid daily. Her TSH was 14.4 and her free T4 was borderline normal at 0.83. She was instructed to followup with Dr. Leslie Dales in 1 week or less.  Abnormal bladder wall thickening/UTI. The patient's urinalysis revealed bacteria, WBCs, and RBCs. CT scan of her abdomen and pelvis revealed a thickened bladder wall. She was continued on IV antibiotics started for treatment of enteritis.  Her urine culture grew out 50,000 colonies of coagulase-negative staph, sensitive to Levaquin. She may need a referral to urology outpatient.   Chronic pain syndrome. The patient is followed by pain  physician Dr. Thyra Breed. When she was admitted, she had four 75 mcg fentanyl patches on. They were all taken off. She was subsequently restarted on  fentanyl transdermal at 100 mcg every 48 hours. When she became less encephalopathic, she stated that she simply forgot that she had the other patches on and forgot to take them off before putting on the 2 new ones. Apparently, Dr. Vear Clock stop MS Contin. The patient was advised to followup with Dr. Vear Clock in to make sure that all of her signal patches are taken off before she puts on the new ones. She voiced understanding.  Acute encephalopathy. Etiology was multifactorial including opiate overuse/abuse, hypokalemia, and acute infections. Her mental status improved to return to baseline.   Hyperglycemia. Her hemoglobin A1c was 5.7. She was treated briefly with sliding scale NovoLog. Her blood sugars with her to be monitored in the outpatient setting.   Procedures:  2-D echocardiogram: 11/16/2012:Study Conclusions  - Left ventricle: Poor acoustic windows limit study. Overall LV systolic function appears grossly normal. There is septal hypokinesis. Posterior and interior wallsappears to be contracting. Endocardium to anterior and lateral wal not seen well. The cavity size was normal. Wall thickness was normal. - Right ventricle: Cannot fully evaluate RV function as difficult to see. Transthoracic echocardiography. M-mode, complete 2D, spectral Doppler, and color Doppler. Height: Height: 157.5cm. Height: 62in. Weight: Weight: 53.5kg. Weight: 117.8lb. Body mass index: BMI: 21.6kg/m^2. Body surface area: BSA: 1.24m^2. Patient status: Inpatient. Location: ICU/CCU     Consultations: Cardiologists, Dr. Daleen Squibb and Dr. Tenny Craw. Gastroenterologist, Lionel December, M.D.  Discharge Exam: Filed Vitals:   11/18/12 0530 11/18/12 2057 11/19/12 0210 11/19/12 0540  BP: 125/69 140/91 108/65 128/61  Pulse: 68 55 55 52  Temp: 98.7 F (37.1 C) 97.6 F (36.4 C) 97.3 F (36.3 C) 98.4 F (36.9 C)  TempSrc: Oral Oral Oral Oral  Resp: 16 20 18 20   Height:      Weight:      SpO2: 96% 100% 94% 100%     General: Alert and oriented 57 year old Caucasian woman who appears older than her stated age. Sitting up in a chair, in no acute distress. Cardiovascular: S1, S2, with a soft systolic murmur and bradycardia. Respiratory: Clear to auscultation bilaterally. Neurologic: She is alert and oriented x3. Cranial nerves II through XII are intact. Gait is within normal limits.  Discharge Instructions      Discharge Orders   Future Orders Complete By Expires     Diet - low sodium heart healthy  As directed     Discharge instructions  As directed     Comments:      You will need to call for a followup appointment with Dr. Leslie Dales and Dr. Vear Clock later in the week or early next week. The cardiology's office will call you with the followup appointment. DO NOT TAKE MORE PAIN MEDICINE THAN IS NEEDED. Toprol-XL was temporarily discontinued because of your low heart rate. You'll need your potassium level rechecked in one week or less.    Increase activity slowly  As directed         Medication List    STOP taking these medications       cyclobenzaprine 10 MG tablet  Commonly known as:  FLEXERIL     metoprolol 200 MG 24 hr tablet  Commonly known as:  TOPROL-XL     morphine 60 MG 12 hr tablet  Commonly known as:  MS CONTIN      TAKE these medications  ciprofloxacin 500 MG tablet  Commonly known as:  CIPRO  Take 1 tablet (500 mg total) by mouth 2 (two) times daily. Antibiotic for treatment of your infection.     clopidogrel 75 MG tablet  Commonly known as:  PLAVIX  Take 1 tablet (75 mg total) by mouth daily. For treatment of your heart attack. Stop this medicine if you have rectal bleeding or black stools.     diazepam 10 MG tablet  Commonly known as:  VALIUM  Take 10 mg by mouth every 8 (eight) hours as needed for anxiety.     fentaNYL 75 MCG/HR  Commonly known as:  DURAGESIC - dosed mcg/hr  Place 2 patches (150 mcg total) onto the skin every other day.      levothyroxine 100 MCG Solr injection  Commonly known as:  SYNTHROID, LEVOTHROID  At previous dose per Dr. Leslie Dales.     metroNIDAZOLE 250 MG tablet  Commonly known as:  FLAGYL  Take 1 tablet (250 mg total) by mouth every 8 (eight) hours. For continued treatment of your infection.     pantoprazole 40 MG tablet  Commonly known as:  PROTONIX  Take 1 tablet (40 mg total) by mouth daily. For protection of your stomach lining.     potassium chloride 20 MEQ/15ML (10%) solution  Take 30 mLs (40 mEq total) by mouth 2 (two) times daily.       Follow-up Information   Follow up with ALTHEIMER,MICHAEL D, MD. Schedule an appointment as soon as possible for a visit in 1 week.   Contact information:   [address] [place] [address] [phone]       Follow up with PHILLIPS,MARK, MD. Schedule an appointment as soon as possible for a visit in 1 week.   Contact information:   [address], [address] [place] [address] [phone]       Follow up with REHMAN,NAJEEB U, MD. Schedule an appointment as soon as possible for a visit in 1 month.   Contact information:   [address], [address] [place] [address] [phone]        The results of significant diagnostics from this hospitalization (including imaging, microbiology, ancillary and laboratory) are listed below for reference.    Significant Diagnostic Studies: Dg Chest Port 1 View  11/16/2012  *RADIOLOGY REPORT*  Clinical Data: PICC placement.  PORTABLE CHEST - 1 VIEW  Comparison: Chest 11/14/2012.  Findings: The patient has a new left PICC.  The tip of the catheter appears kinked and could be within the azygos vein.  Port-A-Cath on the right is again seen.  Lungs demonstrate minimal basilar atelectasis.  Heart size is normal.  No pneumothorax.  IMPRESSION: Tip of new left PICC is kinked and could be within the azygos vein. Lateral film could be used for confirmation.   Original Report Authenticated By: Holley Dexter, M.D.    Dg Chest Portable 1 View  11/14/2012  *RADIOLOGY REPORT*  Clinical Data: Abdominal pain, altered mental status, history hypertension, former smoker  PORTABLE CHEST - 1 VIEW  Comparison: Portable exam 2145 hours compared to 04/17/2012  Findings: Lordotic positioning. Right subclavian Port-A-Cath with tip projecting over proximal SVC. Normal heart size, mediastinal contours, and pulmonary vascularity. Emphysematous and minimal bronchitic changes question COPD. No acute infiltrate, pleural effusion or pneumothorax. Probable right nipple shadow. No pleural effusion or pneumothorax.  IMPRESSION: Question COPD. No acute abnormalities.   Original Report Authenticated By: Ulyses Southward, M.D.  Dg Chest Port 1v Same Day  11/16/2012  *RADIOLOGY REPORT*  Clinical Data: Confirm PICC line placement.  PORTABLE CHEST - 1 VIEW SAME DAY  Comparison: 11/16/2012  Findings: Left PICC line has been repositioned.  The tip is in the SVC.  Right Port-A-Cath is unchanged.  There is hyperinflation of the lungs compatible with COPD. Scarring in the lung bases.  Heart is normal size.  IMPRESSION: Left PICC line tip in the SVC.  COPD/chronic changes.   Original Report Authenticated By: Charlett Nose, M.D.    Ct Cta Abd/pel W/cm &/or W/o Cm  11/15/2012  *RADIOLOGY REPORT*  Clinical Data: Left-sided abdominal pain  CT ANGIOGRAPHY ABDOMEN AND PELVIS WITH CONTRAST AND WITHOUT CONTRAST  Contrast:  100 ml Omnipaque three fifty  Comparison: None.  Findings: Lung bases are predominately clear with mild areas of scarring along the periphery posteriorly.  Heart size within normal limits.  There is scattered atherosclerotic disease of the aorta and branch vessels including advanced atherosclerotic plaque of the aorta at the level of the IMA origin which results in luminal narrowing of 50%.  Poststenotic ectasia at 1.8 cm.  The aorta measures 1.5 cm above the level of the stenosis.  The celiac axis, SMA, single renal arteries, and  IMA remain patent. There is advanced atherosclerotic disease of the bilateral common iliac arteries without aneurysmal dilatation.  Iliac branch vessels remain patent.  Organ evaluation is limited in the arterial and delayed phases. Allowing for this, low attenuation of the liver suggests fatty infiltration.  Unremarkable spleen, pancreas, adrenal glands.  Absent gallbladder.  Mild biliary ductal dilatation is nonspecific.  Symmetric renal enhancement.  No hydronephrosis or hydroureter.  No CT evidence for colitis.  Mild colonic diverticulosis.  There are several thickened small bowel loops with mucosal hyperenhancement (see series 4 image 118 as index).  There is mesenteric fat stranding and intraperitoneal fluid within the abdomen pelvis.  No free intraperitoneal air.  No lymphadenopathy.  Bladder wall thickening along the anterior and lateral margins, somewhat nodular on the right as seen on coronal image 69. Relative sparing posteriorly.  No acute osseous finding.  IMPRESSION: Advanced atherosclerotic disease of the aorta as above. Branch vessels remain patent.  Thickened loops of small bowel with mucosal hyperenhancement.  This is in keeping with a nonspecific enteritis (infectious, ischemic, and inflammatory considerations).  There is free intraperitoneal fluid which may be reactive.  Hepatic steatosis.  Enhancing bladder wall, somewhat nodular on the right.  Recommend urinalysis and cystoscopy correlation.   Original Report Authenticated By: Jearld Lesch, M.D.     Microbiology: Recent Results (from the past 240 hour(s))  URINE CULTURE     Status: None   Collection Time    11/14/12  9:55 PM      Result Value Range Status   Specimen Description URINE, CATHETERIZED   Final   Special Requests NONE   Final   Culture  Setup Time 11/16/2012 02:37   Final   Colony Count 50,000 COLONIES/ML   Final   Culture     Final   Value: STAPHYLOCOCCUS SPECIES (COAGULASE NEGATIVE)     Note: RIFAMPIN AND  GENTAMICIN SHOULD NOT BE USED AS SINGLE DRUGS FOR TREATMENT OF STAPH INFECTIONS.   Report Status 11/17/2012 FINAL   Final   Organism ID, Bacteria STAPHYLOCOCCUS SPECIES (COAGULASE NEGATIVE)   Final  MRSA PCR SCREENING     Status: None   Collection Time    11/15/12  4:14 AM  Result Value Range Status   MRSA by PCR NEGATIVE  NEGATIVE Final   Comment:            The GeneXpert MRSA Assay (FDA     approved for NASAL specimens     only), is one component of a     comprehensive MRSA colonization     surveillance program. It is not     intended to diagnose MRSA     infection nor to guide or     monitor treatment for     MRSA infections.  CLOSTRIDIUM DIFFICILE BY PCR     Status: None   Collection Time    11/18/12  5:26 PM      Result Value Range Status   C difficile by pcr NEGATIVE  NEGATIVE Final     Labs: Basic Metabolic Panel:  Recent Labs Lab 11/14/12 2136 11/15/12 0518  11/15/12 2142 11/16/12 0341 11/16/12 1958 11/17/12 0635 11/18/12 0527 11/19/12 0620  NA 136 137  < > 138 139 138 136 138 136  K <2.0* <2.0*  < > 2.7* 2.6* 2.7* 3.3* 3.9 3.8  CL 93* 97  < > 102 104 103 103 107 106  CO2 31 31  < > 28 26 26 26 24 23   GLUCOSE 171* 106*  < > 117* 159* 87 82 80 81  BUN 6 6  < > 6 5* 4* 3* 3* 4*  CREATININE 0.81 0.81  < > 0.79 0.70 0.75 0.75 0.71 0.71  CALCIUM 8.1* 7.9*  < > 8.0* 7.5* 7.5* 7.3* 7.4* 7.7*  MG 2.0 1.9  --  2.1  --   --   --   --   --   < > = values in this interval not displayed. Liver Function Tests:  Recent Labs Lab 11/14/12 2136 11/15/12 2142 11/16/12 0341  AST 36 35 31  ALT 20 17 14   ALKPHOS 67 64 51  BILITOT 0.4 0.4 0.3  PROT 6.4 6.1 5.1*  ALBUMIN 2.6* 2.6* 2.2*    Recent Labs Lab 11/14/12 2136  LIPASE 26   No results found for this basename: AMMONIA,  in the last 168 hours CBC:  Recent Labs Lab 11/14/12 2136 11/15/12 2142 11/16/12 0341 11/17/12 0635 11/18/12 0527 11/19/12 0620  WBC 5.0 8.5 6.6 4.3 4.3 4.2  NEUTROABS 4.1  --    --   --   --   --   HGB 12.8 13.1 11.1* 8.8* 8.7* 9.0*  HCT 37.2 38.5 32.2* 25.2* 25.5* 26.6*  MCV 91.6 93.4 92.8 94.7 95.5 94.7  PLT 323 330 273 159 191 194   Cardiac Enzymes:  Recent Labs Lab 11/14/12 2136 11/15/12 2142 11/15/12 2345 11/16/12 0341 11/16/12 1437  CKTOTAL  --   --  242* 175  --   CKMB  --   --  10.1* 7.3*  --   TROPONINI <0.30 1.81*  --  2.58* 1.36*   BNP: BNP (last 3 results) No results found for this basename: PROBNP,  in the last 8760 hours CBG:  Recent Labs Lab 11/18/12 1144 11/18/12 1641 11/18/12 1807 11/18/12 2048 11/19/12 0725  GLUCAP 96 72 77 114* 81       Signed:  Dariel Betzer  [facility] 11/19/2012, 2:38 PM

## 2012-11-19 NOTE — Progress Notes (Signed)
Pt discharged home today per Dr. Sherrie Mustache. Pt's PIV and PICC removed per protocol.  Sites within normal limits. VS stable at time of discharge. Pt provided with home medication list, discharge instructions and prescriptions. Verbalized understanding. Pt made aware of need to call and make follow up appointments. Verbalized understanding. Stated would call tomorrow to make f/u appointment. Pt left floor via WC in stable condition accompanied by NT.

## 2012-11-21 LAB — FECAL LACTOFERRIN, QUANT: Fecal Lactoferrin: NEGATIVE

## 2012-11-21 LAB — OVA AND PARASITE EXAMINATION

## 2012-11-21 NOTE — Progress Notes (Signed)
I have spoken to Dr. Leslie Dales regarding treatment of the patient's hypothyroidism. Per Dr. Leslie Dales, the patient's IV Synthroid dose was 600 mcg IV twice weekly. A standing order was established for her to get injections at the short stay center at [facility]. However, the patient was lost to follow-up and had been essentially non-compliant. He saw her in his office in September 2013 and has not seen her since then. She actually called his office yesterday to establish a follow up appointment.  I also called [facility] to arrange a hospital follow-up. Terri at [facility] will call her today to schedule an appointment.

## 2012-11-23 LAB — STOOL CULTURE

## 2012-11-28 ENCOUNTER — Encounter (HOSPITAL_COMMUNITY)
Admission: RE | Admit: 2012-11-28 | Discharge: 2012-11-28 | Disposition: A | Discharge status: Home / Self Care | Payer: Medicare Other | Source: Ambulatory Visit | Attending: Endocrinology | Admitting: Endocrinology

## 2012-11-28 DIAGNOSIS — E039 Hypothyroidism, unspecified: Secondary | ICD-10-CM | POA: Insufficient documentation

## 2012-11-28 DIAGNOSIS — E063 Autoimmune thyroiditis: Secondary | ICD-10-CM | POA: Insufficient documentation

## 2012-11-28 MED ORDER — SODIUM CHLORIDE 0.9 % IV SOLN: Freq: Once | INTRAVENOUS | Status: DC

## 2012-11-28 MED ORDER — LEVOTHYROXINE SODIUM 100 MCG IV SOLR
500.0000 ug | INTRAVENOUS | Status: DC
  Administered 2012-11-28: 500 ug via INTRAVENOUS
  Filled 2012-11-28: qty 25

## 2012-11-29 ENCOUNTER — Encounter: Payer: Medicare Other | Admitting: Physician Assistant

## 2012-11-30 ENCOUNTER — Encounter (HOSPITAL_COMMUNITY)
Admission: RE | Admit: 2012-11-30 | Discharge: 2012-11-30 | Disposition: A | Discharge status: Home / Self Care | Payer: Medicare Other | Source: Ambulatory Visit | Attending: Endocrinology | Admitting: Endocrinology

## 2012-11-30 MED ORDER — LEVOTHYROXINE SODIUM 100 MCG IV SOLR
500.0000 ug | INTRAVENOUS | Status: DC
  Administered 2012-11-30: 500 ug via INTRAVENOUS
  Filled 2012-11-30: qty 25

## 2012-12-05 ENCOUNTER — Encounter (HOSPITAL_COMMUNITY)
Admission: RE | Admit: 2012-12-05 | Discharge: 2012-12-05 | Disposition: A | Discharge status: Home / Self Care | Payer: Medicare Other | Source: Ambulatory Visit | Attending: Endocrinology | Admitting: Endocrinology

## 2012-12-05 DIAGNOSIS — E063 Autoimmune thyroiditis: Secondary | ICD-10-CM | POA: Insufficient documentation

## 2012-12-05 DIAGNOSIS — E039 Hypothyroidism, unspecified: Secondary | ICD-10-CM | POA: Insufficient documentation

## 2012-12-05 MED ORDER — LEVOTHYROXINE SODIUM 100 MCG IV SOLR
500.0000 ug | INTRAVENOUS | Status: DC
  Administered 2012-12-05: 500 ug via INTRAVENOUS
  Filled 2012-12-05: qty 25

## 2012-12-05 MED ORDER — SODIUM CHLORIDE 0.9 % IV SOLN
Freq: Once | INTRAVENOUS | Status: DC
  Administered 2012-12-05: 12:00:00 via INTRAVENOUS

## 2012-12-07 ENCOUNTER — Other Ambulatory Visit (HOSPITAL_COMMUNITY): Payer: Self-pay | Admitting: *Deleted

## 2012-12-08 ENCOUNTER — Encounter (HOSPITAL_COMMUNITY)
Admission: RE | Admit: 2012-12-08 | Discharge: 2012-12-08 | Disposition: A | Discharge status: Home / Self Care | Payer: Medicare Other | Source: Ambulatory Visit | Attending: Endocrinology | Admitting: Endocrinology

## 2012-12-08 MED ORDER — LEVOTHYROXINE SODIUM 500 MCG IV SOLR
500.0000 ug | INTRAVENOUS | Status: DC
  Filled 2012-12-08: qty 5

## 2012-12-08 MED ORDER — SODIUM CHLORIDE 0.9 % IV SOLN: Freq: Once | INTRAVENOUS | Status: DC

## 2012-12-08 MED ORDER — LEVOTHYROXINE SODIUM 100 MCG IV SOLR
500.0000 ug | INTRAVENOUS | Status: DC
  Administered 2012-12-08: 500 ug via INTRAVENOUS
  Filled 2012-12-08 (×2): qty 25

## 2012-12-11 ENCOUNTER — Other Ambulatory Visit (HOSPITAL_COMMUNITY): Payer: Self-pay | Admitting: *Deleted

## 2012-12-12 ENCOUNTER — Encounter (HOSPITAL_COMMUNITY): Payer: Medicare Other

## 2012-12-18 ENCOUNTER — Other Ambulatory Visit (HOSPITAL_COMMUNITY): Payer: Self-pay | Admitting: *Deleted

## 2012-12-19 ENCOUNTER — Encounter (HOSPITAL_COMMUNITY)
Admission: RE | Admit: 2012-12-19 | Discharge: 2012-12-19 | Disposition: A | Discharge status: Home / Self Care | Payer: Medicare Other | Source: Ambulatory Visit | Attending: Endocrinology | Admitting: Endocrinology

## 2012-12-19 MED ORDER — SODIUM CHLORIDE 0.9 % IV SOLN: Freq: Once | INTRAVENOUS | Status: DC

## 2012-12-19 MED ORDER — LEVOTHYROXINE SODIUM 100 MCG IV SOLR
500.0000 ug | INTRAVENOUS | Status: DC
  Administered 2012-12-19: 500 ug via INTRAVENOUS
  Filled 2012-12-19 (×2): qty 25

## 2012-12-22 ENCOUNTER — Encounter (HOSPITAL_COMMUNITY)
Admission: RE | Admit: 2012-12-22 | Discharge: 2012-12-22 | Disposition: A | Discharge status: Home / Self Care | Payer: Medicare Other | Source: Ambulatory Visit | Attending: Endocrinology | Admitting: Endocrinology

## 2012-12-22 MED ORDER — LEVOTHYROXINE SODIUM 100 MCG IV SOLR
500.0000 ug | INTRAVENOUS | Status: DC
  Administered 2012-12-22: 500 ug via INTRAVENOUS
  Filled 2012-12-22: qty 25

## 2012-12-22 MED ORDER — SODIUM CHLORIDE 0.9 % IV SOLN
Freq: Once | INTRAVENOUS | Status: DC
  Administered 2012-12-22: 09:00:00 via INTRAVENOUS

## 2012-12-26 ENCOUNTER — Encounter (HOSPITAL_COMMUNITY)
Admission: RE | Admit: 2012-12-26 | Discharge: 2012-12-26 | Disposition: A | Discharge status: Home / Self Care | Payer: Medicare Other | Source: Ambulatory Visit | Attending: Endocrinology | Admitting: Endocrinology

## 2012-12-26 MED ORDER — LEVOTHYROXINE SODIUM 100 MCG IV SOLR
500.0000 ug | INTRAVENOUS | Status: DC
  Administered 2012-12-26: 500 ug via INTRAVENOUS
  Filled 2012-12-26 (×2): qty 25

## 2012-12-26 MED ORDER — SODIUM CHLORIDE 0.9 % IV SOLN
Freq: Once | INTRAVENOUS | Status: DC
  Administered 2012-12-26: 09:00:00 via INTRAVENOUS

## 2012-12-27 ENCOUNTER — Other Ambulatory Visit (HOSPITAL_COMMUNITY): Payer: Self-pay | Admitting: *Deleted

## 2012-12-28 ENCOUNTER — Encounter (HOSPITAL_COMMUNITY)
Admission: RE | Admit: 2012-12-28 | Discharge: 2012-12-28 | Disposition: A | Discharge status: Home / Self Care | Payer: Medicare Other | Source: Ambulatory Visit | Attending: Endocrinology | Admitting: Endocrinology

## 2012-12-28 MED ORDER — SODIUM CHLORIDE 0.9 % IV SOLN: Freq: Once | INTRAVENOUS | Status: DC

## 2012-12-28 MED ORDER — LEVOTHYROXINE SODIUM 100 MCG IV SOLR
500.0000 ug | INTRAVENOUS | Status: DC
  Administered 2012-12-28: 500 ug via INTRAVENOUS
  Filled 2012-12-28: qty 25

## 2013-01-02 ENCOUNTER — Encounter (HOSPITAL_COMMUNITY)
Admission: RE | Admit: 2013-01-02 | Discharge: 2013-01-02 | Disposition: A | Discharge status: Home / Self Care | Payer: Medicare Other | Source: Ambulatory Visit | Attending: Endocrinology | Admitting: Endocrinology

## 2013-01-02 ENCOUNTER — Encounter (HOSPITAL_COMMUNITY): Payer: Medicare Other

## 2013-01-02 MED ORDER — SODIUM CHLORIDE 0.9 % IV SOLN
Freq: Once | INTRAVENOUS | Status: DC
  Administered 2013-01-02: 09:00:00 via INTRAVENOUS

## 2013-01-02 MED ORDER — LEVOTHYROXINE SODIUM 100 MCG IV SOLR
500.0000 ug | INTRAVENOUS | Status: DC
  Administered 2013-01-02: 500 ug via INTRAVENOUS
  Filled 2013-01-02: qty 25

## 2013-01-05 ENCOUNTER — Encounter (HOSPITAL_COMMUNITY)
Admission: RE | Admit: 2013-01-05 | Discharge: 2013-01-05 | Disposition: A | Discharge status: Home / Self Care | Payer: Medicare Other | Source: Ambulatory Visit | Attending: Endocrinology | Admitting: Endocrinology

## 2013-01-05 DIAGNOSIS — E039 Hypothyroidism, unspecified: Secondary | ICD-10-CM | POA: Insufficient documentation

## 2013-01-05 DIAGNOSIS — E063 Autoimmune thyroiditis: Secondary | ICD-10-CM | POA: Insufficient documentation

## 2013-01-05 MED ORDER — LEVOTHYROXINE SODIUM 100 MCG IV SOLR
500.0000 ug | INTRAVENOUS | Status: DC
  Administered 2013-01-05: 500 ug via INTRAVENOUS
  Filled 2013-01-05: qty 25

## 2013-01-05 MED ORDER — SODIUM CHLORIDE 0.9 % IV SOLN
Freq: Once | INTRAVENOUS | Status: DC
  Administered 2013-01-05: 11:00:00 via INTRAVENOUS

## 2013-01-09 ENCOUNTER — Encounter (HOSPITAL_COMMUNITY): Payer: Medicare Other

## 2013-01-16 ENCOUNTER — Encounter (HOSPITAL_COMMUNITY)
Admission: RE | Admit: 2013-01-16 | Discharge: 2013-01-16 | Disposition: A | Discharge status: Home / Self Care | Payer: Medicare Other | Source: Ambulatory Visit | Attending: Endocrinology | Admitting: Endocrinology

## 2013-01-16 MED ORDER — LEVOTHYROXINE SODIUM 100 MCG IV SOLR
500.0000 ug | INTRAVENOUS | Status: DC
  Administered 2013-01-16: 500 ug via INTRAVENOUS
  Filled 2013-01-16: qty 25

## 2013-01-16 MED ORDER — SODIUM CHLORIDE 0.9 % IV SOLN
Freq: Once | INTRAVENOUS | Status: DC
  Administered 2013-01-16: 250 mL via INTRAVENOUS

## 2013-01-18 ENCOUNTER — Encounter (HOSPITAL_COMMUNITY)
Admission: RE | Admit: 2013-01-18 | Discharge: 2013-01-18 | Disposition: A | Discharge status: Home / Self Care | Payer: Medicare Other | Source: Ambulatory Visit | Attending: Endocrinology | Admitting: Endocrinology

## 2013-01-18 MED ORDER — LEVOTHYROXINE SODIUM 100 MCG IV SOLR
500.0000 ug | INTRAVENOUS | Status: DC
  Administered 2013-01-18: 500 ug via INTRAVENOUS
  Filled 2013-01-18: qty 25

## 2013-01-18 MED ORDER — SODIUM CHLORIDE 0.9 % IV SOLN
Freq: Once | INTRAVENOUS | Status: DC
  Administered 2013-01-18: 10:00:00 via INTRAVENOUS

## 2013-01-23 ENCOUNTER — Encounter (HOSPITAL_COMMUNITY)
Admission: RE | Admit: 2013-01-23 | Discharge: 2013-01-23 | Disposition: A | Discharge status: Home / Self Care | Payer: Medicare Other | Source: Ambulatory Visit | Attending: Endocrinology | Admitting: Endocrinology

## 2013-01-23 MED ORDER — LEVOTHYROXINE SODIUM 100 MCG IV SOLR
500.0000 ug | INTRAVENOUS | Status: DC
  Administered 2013-01-23: 500 ug via INTRAVENOUS
  Filled 2013-01-23: qty 25

## 2013-01-23 MED ORDER — SODIUM CHLORIDE 0.9 % IV SOLN: Freq: Once | INTRAVENOUS | Status: DC

## 2013-01-26 ENCOUNTER — Encounter (HOSPITAL_COMMUNITY): Payer: Medicare Other

## 2013-02-06 ENCOUNTER — Encounter (HOSPITAL_COMMUNITY)
Admission: RE | Admit: 2013-02-06 | Discharge: 2013-02-06 | Disposition: A | Discharge status: Home / Self Care | Payer: Medicare Other | Source: Ambulatory Visit | Attending: Endocrinology | Admitting: Endocrinology

## 2013-02-06 DIAGNOSIS — E063 Autoimmune thyroiditis: Secondary | ICD-10-CM | POA: Insufficient documentation

## 2013-02-06 DIAGNOSIS — E039 Hypothyroidism, unspecified: Secondary | ICD-10-CM | POA: Insufficient documentation

## 2013-02-06 MED ORDER — SODIUM CHLORIDE 0.9 % IV SOLN
Freq: Once | INTRAVENOUS | Status: AC
  Administered 2013-02-06: 12:00:00 via INTRAVENOUS

## 2013-02-06 MED ORDER — LEVOTHYROXINE SODIUM 100 MCG IV SOLR
500.0000 ug | INTRAVENOUS | Status: DC
  Administered 2013-02-06: 500 ug via INTRAVENOUS
  Filled 2013-02-06: qty 25

## 2013-02-08 ENCOUNTER — Encounter (HOSPITAL_COMMUNITY)
Admission: RE | Admit: 2013-02-08 | Discharge: 2013-02-08 | Disposition: A | Discharge status: Home / Self Care | Payer: Medicare Other | Source: Ambulatory Visit | Attending: Endocrinology | Admitting: Endocrinology

## 2013-02-08 MED ORDER — LEVOTHYROXINE SODIUM 100 MCG IV SOLR
500.0000 ug | INTRAVENOUS | Status: DC
  Administered 2013-02-08: 500 ug via INTRAVENOUS
  Filled 2013-02-08: qty 25

## 2013-02-08 MED ORDER — SODIUM CHLORIDE 0.9 % IV SOLN
Freq: Once | INTRAVENOUS | Status: DC
  Administered 2013-02-08: 11:00:00 via INTRAVENOUS

## 2013-02-12 ENCOUNTER — Other Ambulatory Visit (HOSPITAL_COMMUNITY): Payer: Self-pay | Admitting: *Deleted

## 2013-02-13 ENCOUNTER — Encounter (HOSPITAL_COMMUNITY)
Admission: RE | Admit: 2013-02-13 | Discharge: 2013-02-13 | Disposition: A | Discharge status: Home / Self Care | Payer: Medicare Other | Source: Ambulatory Visit | Attending: Endocrinology | Admitting: Endocrinology

## 2013-02-13 MED ORDER — LEVOTHYROXINE SODIUM 100 MCG IV SOLR
500.0000 ug | INTRAVENOUS | Status: DC
  Administered 2013-02-13: 500 ug via INTRAVENOUS
  Filled 2013-02-13: qty 25

## 2013-02-13 MED ORDER — SODIUM CHLORIDE 0.9 % IV SOLN
Freq: Once | INTRAVENOUS | Status: AC
  Administered 2013-02-13: 250 mL via INTRAVENOUS

## 2013-02-13 NOTE — Progress Notes (Signed)
1042 22G x 1 inch angiocath inserted into right antecubital.

## 2013-02-15 ENCOUNTER — Encounter (HOSPITAL_COMMUNITY)
Admission: RE | Admit: 2013-02-15 | Discharge: 2013-02-15 | Disposition: A | Discharge status: Home / Self Care | Payer: Medicare Other | Source: Ambulatory Visit | Attending: Endocrinology | Admitting: Endocrinology

## 2013-02-15 MED ORDER — LEVOTHYROXINE SODIUM 100 MCG IV SOLR
500.0000 ug | INTRAVENOUS | Status: DC
  Administered 2013-02-15: 500 ug via INTRAVENOUS
  Filled 2013-02-15: qty 25

## 2013-02-15 MED ORDER — SODIUM CHLORIDE 0.9 % IV SOLN
Freq: Once | INTRAVENOUS | Status: AC
  Administered 2013-02-15: 10:00:00 via INTRAVENOUS

## 2013-02-20 ENCOUNTER — Inpatient Hospital Stay (HOSPITAL_COMMUNITY): Admission: RE | Admit: 2013-02-20 | Payer: Medicare Other | Source: Ambulatory Visit

## 2013-03-01 ENCOUNTER — Ambulatory Visit (INDEPENDENT_AMBULATORY_CARE_PROVIDER_SITE_OTHER): Payer: Medicare Other | Admitting: Internal Medicine

## 2013-04-24 ENCOUNTER — Encounter (HOSPITAL_COMMUNITY)
Admission: RE | Admit: 2013-04-24 | Discharge: 2013-04-24 | Disposition: A | Discharge status: Home / Self Care | Payer: Medicare Other | Source: Ambulatory Visit | Attending: Endocrinology | Admitting: Endocrinology

## 2013-04-24 DIAGNOSIS — E039 Hypothyroidism, unspecified: Secondary | ICD-10-CM | POA: Insufficient documentation

## 2013-04-24 DIAGNOSIS — E063 Autoimmune thyroiditis: Secondary | ICD-10-CM | POA: Insufficient documentation

## 2013-04-24 MED ORDER — LEVOTHYROXINE SODIUM 100 MCG IV SOLR
500.0000 ug | INTRAVENOUS | Status: DC
  Administered 2013-04-24: 500 ug via INTRAVENOUS
  Filled 2013-04-24: qty 25

## 2013-04-24 MED ORDER — SODIUM CHLORIDE 0.9 % IV SOLN
Freq: Once | INTRAVENOUS | Status: DC
  Administered 2013-04-24: 11:00:00 via INTRAVENOUS

## 2013-04-26 ENCOUNTER — Encounter (HOSPITAL_COMMUNITY)
Admission: RE | Admit: 2013-04-26 | Discharge: 2013-04-26 | Disposition: A | Discharge status: Home / Self Care | Payer: Medicare Other | Source: Ambulatory Visit | Attending: Endocrinology | Admitting: Endocrinology

## 2013-04-26 MED ORDER — SODIUM CHLORIDE 0.9 % IV SOLN: Freq: Once | INTRAVENOUS | Status: DC

## 2013-04-26 MED ORDER — LEVOTHYROXINE SODIUM 100 MCG IV SOLR
500.0000 ug | INTRAVENOUS | Status: DC
  Administered 2013-04-26: 500 ug via INTRAVENOUS
  Filled 2013-04-26: qty 25

## 2013-05-01 ENCOUNTER — Encounter (HOSPITAL_COMMUNITY)
Admission: RE | Admit: 2013-05-01 | Discharge: 2013-05-01 | Disposition: A | Discharge status: Home / Self Care | Payer: Medicare Other | Source: Ambulatory Visit | Attending: Endocrinology | Admitting: Endocrinology

## 2013-05-01 MED ORDER — SODIUM CHLORIDE 0.9 % IV SOLN
Freq: Once | INTRAVENOUS | Status: DC
  Administered 2013-05-01: 12:00:00 via INTRAVENOUS

## 2013-05-01 MED ORDER — LEVOTHYROXINE SODIUM 100 MCG IV SOLR
500.0000 ug | INTRAVENOUS | Status: DC
  Administered 2013-05-01: 500 ug via INTRAVENOUS
  Filled 2013-05-01: qty 25

## 2013-05-03 ENCOUNTER — Inpatient Hospital Stay (HOSPITAL_COMMUNITY): Admission: RE | Admit: 2013-05-03 | Payer: Medicare Other | Source: Ambulatory Visit

## 2013-05-08 ENCOUNTER — Inpatient Hospital Stay (HOSPITAL_COMMUNITY): Admission: RE | Admit: 2013-05-08 | Payer: Medicare Other | Source: Ambulatory Visit

## 2013-05-09 ENCOUNTER — Other Ambulatory Visit (HOSPITAL_COMMUNITY): Payer: Self-pay

## 2013-05-10 ENCOUNTER — Encounter (HOSPITAL_COMMUNITY)
Admission: RE | Admit: 2013-05-10 | Discharge: 2013-05-10 | Disposition: A | Discharge status: Home / Self Care | Payer: Medicare Other | Source: Ambulatory Visit | Attending: Endocrinology | Admitting: Endocrinology

## 2013-05-10 DIAGNOSIS — E039 Hypothyroidism, unspecified: Secondary | ICD-10-CM | POA: Insufficient documentation

## 2013-05-10 DIAGNOSIS — E063 Autoimmune thyroiditis: Secondary | ICD-10-CM | POA: Insufficient documentation

## 2013-05-10 MED ORDER — LEVOTHYROXINE SODIUM 100 MCG IV SOLR
500.0000 ug | INTRAVENOUS | Status: DC
  Administered 2013-05-10: 500 ug via INTRAVENOUS
  Filled 2013-05-10: qty 25

## 2013-05-10 MED ORDER — SODIUM CHLORIDE 0.9 % IV SOLN: Freq: Once | INTRAVENOUS | Status: DC

## 2013-05-15 ENCOUNTER — Encounter (HOSPITAL_COMMUNITY)
Admission: RE | Admit: 2013-05-15 | Discharge: 2013-05-15 | Disposition: A | Discharge status: Home / Self Care | Payer: Medicare Other | Source: Ambulatory Visit | Attending: Endocrinology | Admitting: Endocrinology

## 2013-05-15 MED ORDER — SODIUM CHLORIDE 0.9 % IV SOLN
Freq: Once | INTRAVENOUS | Status: DC
  Administered 2013-05-15: 10:00:00 via INTRAVENOUS

## 2013-05-15 MED ORDER — LEVOTHYROXINE SODIUM 100 MCG IV SOLR
500.0000 ug | INTRAVENOUS | Status: DC
  Administered 2013-05-15: 500 ug via INTRAVENOUS
  Filled 2013-05-15: qty 25

## 2013-05-17 ENCOUNTER — Encounter (HOSPITAL_COMMUNITY): Payer: Medicare Other

## 2013-05-22 ENCOUNTER — Encounter (HOSPITAL_COMMUNITY)
Admission: RE | Admit: 2013-05-22 | Discharge: 2013-05-22 | Disposition: A | Discharge status: Home / Self Care | Payer: Medicare Other | Source: Ambulatory Visit | Attending: Endocrinology | Admitting: Endocrinology

## 2013-05-22 MED ORDER — SODIUM CHLORIDE 0.9 % IV SOLN
Freq: Once | INTRAVENOUS | Status: DC
  Administered 2013-05-22: 10:00:00 via INTRAVENOUS

## 2013-05-22 MED ORDER — LEVOTHYROXINE SODIUM 100 MCG IV SOLR
500.0000 ug | INTRAVENOUS | Status: DC
  Administered 2013-05-22: 500 ug via INTRAVENOUS
  Filled 2013-05-22: qty 25

## 2013-05-24 ENCOUNTER — Encounter (HOSPITAL_COMMUNITY)
Admission: RE | Admit: 2013-05-24 | Discharge: 2013-05-24 | Disposition: A | Discharge status: Home / Self Care | Payer: Medicare Other | Source: Ambulatory Visit | Attending: Endocrinology | Admitting: Endocrinology

## 2013-05-24 MED ORDER — LEVOTHYROXINE SODIUM 100 MCG IV SOLR
500.0000 ug | INTRAVENOUS | Status: DC
  Administered 2013-05-24: 500 ug via INTRAVENOUS
  Filled 2013-05-24: qty 25

## 2013-05-24 MED ORDER — SODIUM CHLORIDE 0.9 % IV SOLN
Freq: Once | INTRAVENOUS | Status: DC
  Administered 2013-05-24: 11:00:00 via INTRAVENOUS

## 2013-05-28 ENCOUNTER — Other Ambulatory Visit (HOSPITAL_COMMUNITY): Payer: Self-pay | Admitting: *Deleted

## 2013-05-29 ENCOUNTER — Inpatient Hospital Stay (HOSPITAL_COMMUNITY): Admission: RE | Admit: 2013-05-29 | Payer: Medicare Other | Source: Ambulatory Visit

## 2013-05-31 ENCOUNTER — Encounter (HOSPITAL_COMMUNITY)
Admission: RE | Admit: 2013-05-31 | Discharge: 2013-05-31 | Disposition: A | Discharge status: Home / Self Care | Payer: Medicare Other | Source: Ambulatory Visit | Attending: Endocrinology | Admitting: Endocrinology

## 2013-05-31 MED ORDER — LEVOTHYROXINE SODIUM 100 MCG IV SOLR
500.0000 ug | INTRAVENOUS | Status: DC
  Administered 2013-05-31: 500 ug via INTRAVENOUS
  Filled 2013-05-31: qty 25

## 2013-05-31 MED ORDER — SODIUM CHLORIDE 0.9 % IV SOLN
Freq: Once | INTRAVENOUS | Status: AC
  Administered 2013-05-31: 11:00:00 via INTRAVENOUS

## 2013-06-05 ENCOUNTER — Encounter (HOSPITAL_COMMUNITY)
Admission: RE | Admit: 2013-06-05 | Discharge: 2013-06-05 | Disposition: A | Discharge status: Home / Self Care | Payer: Medicare Other | Source: Ambulatory Visit | Attending: Endocrinology | Admitting: Endocrinology

## 2013-06-05 MED ORDER — SODIUM CHLORIDE 0.9 % IV SOLN
INTRAVENOUS | Status: DC
  Administered 2013-06-05: 11:00:00 via INTRAVENOUS

## 2013-06-05 MED ORDER — LEVOTHYROXINE SODIUM 100 MCG IV SOLR
500.0000 ug | INTRAVENOUS | Status: DC
  Administered 2013-06-05: 500 ug via INTRAVENOUS
  Filled 2013-06-05: qty 25

## 2013-06-07 ENCOUNTER — Encounter (HOSPITAL_COMMUNITY): Payer: Medicare Other

## 2013-06-11 ENCOUNTER — Other Ambulatory Visit (HOSPITAL_COMMUNITY): Payer: Self-pay

## 2013-06-12 ENCOUNTER — Inpatient Hospital Stay (HOSPITAL_COMMUNITY): Admission: RE | Admit: 2013-06-12 | Payer: Medicare Other | Source: Ambulatory Visit

## 2013-06-14 ENCOUNTER — Encounter (HOSPITAL_COMMUNITY): Payer: Medicare Other

## 2013-06-19 ENCOUNTER — Encounter (HOSPITAL_COMMUNITY)
Admission: RE | Admit: 2013-06-19 | Discharge: 2013-06-19 | Disposition: A | Discharge status: Home / Self Care | Payer: Medicare Other | Source: Ambulatory Visit | Attending: Endocrinology | Admitting: Endocrinology

## 2013-06-19 DIAGNOSIS — E039 Hypothyroidism, unspecified: Secondary | ICD-10-CM | POA: Insufficient documentation

## 2013-06-19 DIAGNOSIS — E063 Autoimmune thyroiditis: Secondary | ICD-10-CM | POA: Insufficient documentation

## 2013-06-19 MED ORDER — SODIUM CHLORIDE 0.9 % IV SOLN
INTRAVENOUS | Status: DC
  Administered 2013-06-19: 11:00:00 via INTRAVENOUS

## 2013-06-19 MED ORDER — LEVOTHYROXINE SODIUM 100 MCG IV SOLR
500.0000 ug | INTRAVENOUS | Status: DC
  Administered 2013-06-19: 500 ug via INTRAVENOUS
  Filled 2013-06-19: qty 25

## 2013-06-21 ENCOUNTER — Encounter (HOSPITAL_COMMUNITY)
Admission: RE | Admit: 2013-06-21 | Discharge: 2013-06-21 | Disposition: A | Discharge status: Home / Self Care | Payer: Medicare Other | Source: Ambulatory Visit | Attending: Endocrinology | Admitting: Endocrinology

## 2013-06-21 MED ORDER — SODIUM CHLORIDE 0.9 % IV SOLN
INTRAVENOUS | Status: DC
  Administered 2013-06-21: 11:00:00 via INTRAVENOUS

## 2013-06-21 MED ORDER — LEVOTHYROXINE SODIUM 100 MCG IV SOLR
500.0000 ug | INTRAVENOUS | Status: DC
  Administered 2013-06-21: 500 ug via INTRAVENOUS
  Filled 2013-06-21: qty 25

## 2013-06-25 ENCOUNTER — Other Ambulatory Visit (HOSPITAL_COMMUNITY): Payer: Self-pay | Admitting: *Deleted

## 2013-06-25 MED ORDER — LEVOTHYROXINE SODIUM 100 MCG IV SOLR: 500.0000 ug | INTRAVENOUS | Status: DC

## 2013-06-26 ENCOUNTER — Encounter (HOSPITAL_COMMUNITY)
Admission: RE | Admit: 2013-06-26 | Discharge: 2013-06-26 | Disposition: A | Discharge status: Home / Self Care | Payer: Medicare Other | Source: Ambulatory Visit | Attending: Endocrinology | Admitting: Endocrinology

## 2013-06-26 DIAGNOSIS — K909 Intestinal malabsorption, unspecified: Secondary | ICD-10-CM | POA: Insufficient documentation

## 2013-06-26 DIAGNOSIS — E063 Autoimmune thyroiditis: Secondary | ICD-10-CM | POA: Insufficient documentation

## 2013-06-26 DIAGNOSIS — E039 Hypothyroidism, unspecified: Secondary | ICD-10-CM | POA: Insufficient documentation

## 2013-06-26 MED ORDER — LEVOTHYROXINE SODIUM 100 MCG IV SOLR
1200.0000 ug | INTRAVENOUS | Status: DC
Start: 2013-06-26 — End: 2013-06-27
  Administered 2013-06-26: 12:00:00 1200 ug via INTRAVENOUS
  Filled 2013-06-26: qty 60

## 2013-06-26 MED ORDER — SODIUM CHLORIDE 0.9 % IV SOLN: INTRAVENOUS | Status: DC

## 2013-06-28 ENCOUNTER — Encounter (HOSPITAL_COMMUNITY): Payer: Medicare Other

## 2013-07-02 ENCOUNTER — Other Ambulatory Visit (HOSPITAL_COMMUNITY): Payer: Self-pay | Admitting: *Deleted

## 2013-07-03 ENCOUNTER — Encounter (HOSPITAL_COMMUNITY)
Admission: RE | Admit: 2013-07-03 | Discharge: 2013-07-03 | Disposition: A | Discharge status: Home / Self Care | Payer: Medicare Other | Source: Ambulatory Visit | Attending: Endocrinology | Admitting: Endocrinology

## 2013-07-03 MED ORDER — LEVOTHYROXINE SODIUM 100 MCG IV SOLR
1200.0000 ug | INTRAVENOUS | Status: DC
  Administered 2013-07-03: 1200 ug via INTRAVENOUS
  Filled 2013-07-03: qty 60

## 2013-07-03 MED ORDER — SODIUM CHLORIDE 0.9 % IV SOLN
INTRAVENOUS | Status: DC
  Administered 2013-07-03: 10:00:00 via INTRAVENOUS

## 2013-07-10 ENCOUNTER — Inpatient Hospital Stay (HOSPITAL_COMMUNITY): Admission: RE | Admit: 2013-07-10 | Payer: Medicare Other | Source: Ambulatory Visit

## 2013-07-17 ENCOUNTER — Encounter (HOSPITAL_COMMUNITY)
Admission: RE | Admit: 2013-07-17 | Discharge: 2013-07-17 | Disposition: A | Discharge status: Home / Self Care | Payer: Medicare Other | Source: Ambulatory Visit | Attending: Endocrinology | Admitting: Endocrinology

## 2013-07-17 DIAGNOSIS — E039 Hypothyroidism, unspecified: Secondary | ICD-10-CM | POA: Insufficient documentation

## 2013-07-17 DIAGNOSIS — E063 Autoimmune thyroiditis: Secondary | ICD-10-CM | POA: Insufficient documentation

## 2013-07-17 MED ORDER — SODIUM CHLORIDE 0.9 % IV SOLN
INTRAVENOUS | Status: DC
  Administered 2013-07-17: 10:00:00 via INTRAVENOUS

## 2013-07-17 MED ORDER — LEVOTHYROXINE SODIUM 100 MCG IV SOLR
1200.0000 ug | INTRAVENOUS | Status: DC
  Administered 2013-07-17: 12:00:00 1200 ug via INTRAVENOUS
  Filled 2013-07-17: qty 60

## 2013-07-24 ENCOUNTER — Encounter (HOSPITAL_COMMUNITY)
Admission: RE | Admit: 2013-07-24 | Discharge: 2013-07-24 | Disposition: A | Discharge status: Home / Self Care | Payer: Medicare Other | Source: Ambulatory Visit | Attending: Endocrinology | Admitting: Endocrinology

## 2013-07-24 MED ORDER — SODIUM CHLORIDE 0.9 % IV SOLN
INTRAVENOUS | Status: DC
  Administered 2013-07-24: 11:00:00 via INTRAVENOUS

## 2013-07-24 MED ORDER — LEVOTHYROXINE SODIUM 100 MCG IV SOLR
1200.0000 ug | INTRAVENOUS | Status: DC
  Administered 2013-07-24: 11:00:00 1200 ug via INTRAVENOUS
  Filled 2013-07-24: qty 60

## 2013-07-31 ENCOUNTER — Encounter (HOSPITAL_COMMUNITY): Payer: Medicare Other

## 2013-08-07 ENCOUNTER — Encounter (HOSPITAL_COMMUNITY)
Admission: RE | Admit: 2013-08-07 | Discharge: 2013-08-07 | Disposition: A | Discharge status: Home / Self Care | Payer: Medicare Other | Source: Ambulatory Visit | Attending: Endocrinology | Admitting: Endocrinology

## 2013-08-07 DIAGNOSIS — E063 Autoimmune thyroiditis: Secondary | ICD-10-CM | POA: Insufficient documentation

## 2013-08-07 DIAGNOSIS — E039 Hypothyroidism, unspecified: Secondary | ICD-10-CM | POA: Insufficient documentation

## 2013-08-07 MED ORDER — LEVOTHYROXINE SODIUM 100 MCG IV SOLR
1200.0000 ug | INTRAVENOUS | Status: DC
  Administered 2013-08-07: 1200 ug via INTRAVENOUS
  Filled 2013-08-07: qty 60

## 2013-08-14 ENCOUNTER — Inpatient Hospital Stay (HOSPITAL_COMMUNITY): Admission: RE | Admit: 2013-08-14 | Payer: Medicare Other | Source: Ambulatory Visit

## 2013-08-21 ENCOUNTER — Encounter (HOSPITAL_COMMUNITY)
Admission: RE | Admit: 2013-08-21 | Discharge: 2013-08-21 | Disposition: A | Discharge status: Home / Self Care | Payer: Medicare Other | Source: Ambulatory Visit | Attending: Endocrinology | Admitting: Endocrinology

## 2013-08-21 MED ORDER — LEVOTHYROXINE SODIUM 100 MCG IV SOLR
1200.0000 ug | INTRAVENOUS | Status: DC
  Administered 2013-08-21: 11:00:00 1200 ug via INTRAVENOUS
  Filled 2013-08-21 (×2): qty 60

## 2013-08-21 MED ORDER — SODIUM CHLORIDE 0.9 % IV SOLN
INTRAVENOUS | Status: DC
  Administered 2013-08-21: 11:00:00 via INTRAVENOUS

## 2013-08-28 ENCOUNTER — Encounter (HOSPITAL_COMMUNITY): Payer: Medicare Other

## 2013-09-04 ENCOUNTER — Encounter (HOSPITAL_COMMUNITY): Payer: Medicare Other

## 2013-09-13 ENCOUNTER — Encounter (HOSPITAL_COMMUNITY)
Admission: RE | Admit: 2013-09-13 | Discharge: 2013-09-13 | Disposition: A | Discharge status: Home / Self Care | Payer: Medicare Other | Source: Ambulatory Visit | Attending: Endocrinology | Admitting: Endocrinology

## 2013-09-13 DIAGNOSIS — E063 Autoimmune thyroiditis: Secondary | ICD-10-CM | POA: Insufficient documentation

## 2013-09-13 DIAGNOSIS — E039 Hypothyroidism, unspecified: Secondary | ICD-10-CM | POA: Insufficient documentation

## 2013-09-13 MED ORDER — LEVOTHYROXINE SODIUM 100 MCG IV SOLR
1200.0000 ug | INTRAVENOUS | Status: DC
  Administered 2013-09-13: 10:00:00 1200 ug via INTRAVENOUS
  Filled 2013-09-13: qty 60

## 2013-09-13 MED ORDER — SODIUM CHLORIDE 0.9 % IV SOLN
INTRAVENOUS | Status: DC
  Administered 2013-09-13: 10:00:00 via INTRAVENOUS

## 2013-09-18 ENCOUNTER — Encounter (HOSPITAL_COMMUNITY)
Admission: RE | Admit: 2013-09-18 | Discharge: 2013-09-18 | Disposition: A | Discharge status: Home / Self Care | Payer: Medicare Other | Source: Ambulatory Visit | Attending: Endocrinology | Admitting: Endocrinology

## 2013-09-18 DIAGNOSIS — K909 Intestinal malabsorption, unspecified: Secondary | ICD-10-CM | POA: Insufficient documentation

## 2013-09-18 DIAGNOSIS — E063 Autoimmune thyroiditis: Secondary | ICD-10-CM | POA: Insufficient documentation

## 2013-09-18 DIAGNOSIS — E039 Hypothyroidism, unspecified: Secondary | ICD-10-CM | POA: Insufficient documentation

## 2013-09-18 MED ORDER — SODIUM CHLORIDE 0.9 % IV SOLN
INTRAVENOUS | Status: DC
  Administered 2013-09-18: 11:00:00 via INTRAVENOUS

## 2013-09-18 MED ORDER — LEVOTHYROXINE SODIUM 100 MCG IV SOLR
1200.0000 ug | INTRAVENOUS | Status: DC
  Administered 2013-09-18: 11:00:00 1200 ug via INTRAVENOUS
  Filled 2013-09-18: qty 60

## 2013-09-25 ENCOUNTER — Encounter (HOSPITAL_COMMUNITY)
Admission: RE | Admit: 2013-09-25 | Discharge: 2013-09-25 | Disposition: A | Discharge status: Home / Self Care | Payer: Medicare Other | Source: Ambulatory Visit | Attending: Endocrinology | Admitting: Endocrinology

## 2013-09-25 MED ORDER — LEVOTHYROXINE SODIUM 100 MCG IV SOLR
1200.0000 ug | INTRAVENOUS | Status: DC
  Administered 2013-09-25: 11:00:00 1200 ug via INTRAVENOUS
  Filled 2013-09-25: qty 60

## 2013-09-25 MED ORDER — SODIUM CHLORIDE 0.9 % IV SOLN
INTRAVENOUS | Status: DC
  Administered 2013-09-25: 11:00:00 via INTRAVENOUS

## 2013-10-02 ENCOUNTER — Encounter (HOSPITAL_COMMUNITY): Payer: Medicare Other

## 2013-10-09 ENCOUNTER — Encounter (HOSPITAL_COMMUNITY)
Admission: RE | Admit: 2013-10-09 | Discharge: 2013-10-09 | Disposition: A | Discharge status: Home / Self Care | Payer: Medicare Other | Source: Ambulatory Visit | Attending: Endocrinology | Admitting: Endocrinology

## 2013-10-09 DIAGNOSIS — E039 Hypothyroidism, unspecified: Secondary | ICD-10-CM | POA: Insufficient documentation

## 2013-10-09 DIAGNOSIS — E063 Autoimmune thyroiditis: Secondary | ICD-10-CM | POA: Insufficient documentation

## 2013-10-09 MED ORDER — LEVOTHYROXINE SODIUM 100 MCG IV SOLR
1200.0000 ug | INTRAVENOUS | Status: DC
  Administered 2013-10-09: 1200 ug via INTRAVENOUS
  Filled 2013-10-09: qty 60

## 2013-10-09 MED ORDER — SODIUM CHLORIDE 0.9 % IV SOLN
INTRAVENOUS | Status: DC
  Administered 2013-10-09: 11:00:00 via INTRAVENOUS

## 2013-10-16 ENCOUNTER — Encounter (HOSPITAL_COMMUNITY)
Admission: RE | Admit: 2013-10-16 | Discharge: 2013-10-16 | Disposition: A | Discharge status: Home / Self Care | Payer: Medicare Other | Source: Ambulatory Visit | Attending: Endocrinology | Admitting: Endocrinology

## 2013-10-16 MED ORDER — LEVOTHYROXINE SODIUM 100 MCG IV SOLR
1200.0000 ug | INTRAVENOUS | Status: DC
  Administered 2013-10-16: 1200 ug via INTRAVENOUS
  Filled 2013-10-16: qty 60

## 2013-10-16 MED ORDER — SODIUM CHLORIDE 0.9 % IV SOLN
INTRAVENOUS | Status: DC
  Administered 2013-10-16: 10:00:00 via INTRAVENOUS

## 2013-10-23 ENCOUNTER — Inpatient Hospital Stay (HOSPITAL_COMMUNITY): Admission: RE | Admit: 2013-10-23 | Payer: Medicare Other | Source: Ambulatory Visit

## 2013-10-30 ENCOUNTER — Inpatient Hospital Stay (HOSPITAL_COMMUNITY): Admission: RE | Admit: 2013-10-30 | Payer: Medicare Other | Source: Ambulatory Visit

## 2013-11-06 ENCOUNTER — Encounter (HOSPITAL_COMMUNITY)
Admission: RE | Admit: 2013-11-06 | Discharge: 2013-11-06 | Disposition: A | Discharge status: Home / Self Care | Payer: Medicare Other | Source: Ambulatory Visit | Attending: Endocrinology | Admitting: Endocrinology

## 2013-11-06 DIAGNOSIS — E039 Hypothyroidism, unspecified: Secondary | ICD-10-CM | POA: Insufficient documentation

## 2013-11-06 DIAGNOSIS — E063 Autoimmune thyroiditis: Secondary | ICD-10-CM | POA: Insufficient documentation

## 2013-11-06 MED ORDER — LEVOTHYROXINE SODIUM 100 MCG IV SOLR
1200.0000 ug | INTRAVENOUS | Status: DC
  Administered 2013-11-06: 11:00:00 1200 ug via INTRAVENOUS
  Filled 2013-11-06: qty 60

## 2013-11-06 MED ORDER — SODIUM CHLORIDE 0.9 % IV SOLN
INTRAVENOUS | Status: DC
  Administered 2013-11-06: 11:00:00 via INTRAVENOUS

## 2013-11-13 ENCOUNTER — Inpatient Hospital Stay (HOSPITAL_COMMUNITY): Admission: RE | Admit: 2013-11-13 | Payer: Medicare Other | Source: Ambulatory Visit

## 2013-11-20 ENCOUNTER — Encounter (HOSPITAL_COMMUNITY)
Admission: RE | Admit: 2013-11-20 | Discharge: 2013-11-20 | Disposition: A | Discharge status: Home / Self Care | Payer: Medicare Other | Source: Ambulatory Visit | Attending: Endocrinology | Admitting: Endocrinology

## 2013-11-20 ENCOUNTER — Other Ambulatory Visit (HOSPITAL_COMMUNITY): Payer: Self-pay | Admitting: Anesthesiology

## 2013-11-20 ENCOUNTER — Ambulatory Visit (HOSPITAL_COMMUNITY)
Admission: RE | Admit: 2013-11-20 | Discharge: 2013-11-20 | Disposition: A | Discharge status: Home / Self Care | Payer: Medicare Other | Source: Ambulatory Visit | Attending: Anesthesiology | Admitting: Anesthesiology

## 2013-11-20 DIAGNOSIS — R52 Pain, unspecified: Secondary | ICD-10-CM

## 2013-11-20 DIAGNOSIS — M533 Sacrococcygeal disorders, not elsewhere classified: Secondary | ICD-10-CM | POA: Insufficient documentation

## 2013-11-20 MED ORDER — LEVOTHYROXINE SODIUM 100 MCG IV SOLR
1200.0000 ug | INTRAVENOUS | Status: DC
  Administered 2013-11-20: 1200 ug via INTRAVENOUS
  Filled 2013-11-20: qty 60

## 2013-11-20 MED ORDER — SODIUM CHLORIDE 0.9 % IV SOLN
INTRAVENOUS | Status: DC
  Administered 2013-11-20: 10:00:00 via INTRAVENOUS

## 2013-11-27 ENCOUNTER — Encounter (HOSPITAL_COMMUNITY)
Admission: RE | Admit: 2013-11-27 | Discharge: 2013-11-27 | Disposition: A | Discharge status: Home / Self Care | Payer: Medicare Other | Source: Ambulatory Visit | Attending: Endocrinology | Admitting: Endocrinology

## 2013-11-27 MED ORDER — SODIUM CHLORIDE 0.9 % IV SOLN
INTRAVENOUS | Status: DC
  Administered 2013-11-27: 12:00:00 via INTRAVENOUS

## 2013-11-27 MED ORDER — LEVOTHYROXINE SODIUM 100 MCG IV SOLR
1200.0000 ug | INTRAVENOUS | Status: DC
  Administered 2013-11-27: 1200 ug via INTRAVENOUS
  Filled 2013-11-27: qty 60

## 2013-12-04 ENCOUNTER — Inpatient Hospital Stay (HOSPITAL_COMMUNITY): Admission: RE | Admit: 2013-12-04 | Payer: Medicare Other | Source: Ambulatory Visit

## 2013-12-11 ENCOUNTER — Encounter (HOSPITAL_COMMUNITY)
Admission: RE | Admit: 2013-12-11 | Discharge: 2013-12-11 | Disposition: A | Discharge status: Home / Self Care | Payer: Medicare Other | Source: Ambulatory Visit | Attending: Endocrinology | Admitting: Endocrinology

## 2013-12-11 DIAGNOSIS — E063 Autoimmune thyroiditis: Secondary | ICD-10-CM | POA: Insufficient documentation

## 2013-12-11 DIAGNOSIS — E039 Hypothyroidism, unspecified: Secondary | ICD-10-CM | POA: Insufficient documentation

## 2013-12-11 MED ORDER — LEVOTHYROXINE SODIUM 100 MCG IV SOLR
1200.0000 ug | INTRAVENOUS | Status: DC
  Administered 2013-12-11: 1200 ug via INTRAVENOUS
  Filled 2013-12-11: qty 60

## 2013-12-11 MED ORDER — SODIUM CHLORIDE 0.9 % IV SOLN
INTRAVENOUS | Status: DC
  Administered 2013-12-11: 11:00:00 250 mL via INTRAVENOUS

## 2013-12-18 ENCOUNTER — Encounter (HOSPITAL_COMMUNITY)
Admission: RE | Admit: 2013-12-18 | Discharge: 2013-12-18 | Disposition: A | Discharge status: Home / Self Care | Payer: Medicare Other | Source: Ambulatory Visit | Attending: Endocrinology | Admitting: Endocrinology

## 2013-12-18 MED ORDER — SODIUM CHLORIDE 0.9 % IV SOLN
INTRAVENOUS | Status: DC
  Administered 2013-12-18: 11:00:00 via INTRAVENOUS

## 2013-12-18 MED ORDER — LEVOTHYROXINE SODIUM 100 MCG IV SOLR
1200.0000 ug | INTRAVENOUS | Status: DC
  Administered 2013-12-18: 1200 ug via INTRAVENOUS
  Filled 2013-12-18: qty 60

## 2013-12-18 MED ORDER — LEVOTHYROXINE SODIUM 500 MCG IJ SOLR
1200.0000 ug | Freq: Once | INTRAMUSCULAR | Status: DC
  Filled 2013-12-18: qty 12

## 2013-12-25 ENCOUNTER — Encounter (HOSPITAL_COMMUNITY)
Admission: RE | Admit: 2013-12-25 | Discharge: 2013-12-25 | Disposition: A | Discharge status: Home / Self Care | Payer: Medicare Other | Source: Ambulatory Visit | Attending: Endocrinology | Admitting: Endocrinology

## 2013-12-25 MED ORDER — LEVOTHYROXINE SODIUM 100 MCG IV SOLR
1200.0000 ug | INTRAVENOUS | Status: DC
  Administered 2013-12-25: 1200 ug via INTRAVENOUS
  Filled 2013-12-25: qty 60

## 2013-12-25 MED ORDER — SODIUM CHLORIDE 0.9 % IV SOLN
INTRAVENOUS | Status: DC
  Administered 2013-12-25: 10:00:00 via INTRAVENOUS

## 2014-01-01 ENCOUNTER — Inpatient Hospital Stay (HOSPITAL_COMMUNITY): Admission: RE | Admit: 2014-01-01 | Payer: Medicare Other | Source: Ambulatory Visit

## 2014-01-07 ENCOUNTER — Other Ambulatory Visit (HOSPITAL_COMMUNITY): Payer: Self-pay | Admitting: *Deleted

## 2014-01-08 ENCOUNTER — Encounter (HOSPITAL_COMMUNITY)
Admission: RE | Admit: 2014-01-08 | Discharge: 2014-01-08 | Disposition: A | Discharge status: Home / Self Care | Payer: Medicare Other | Source: Ambulatory Visit | Attending: Endocrinology | Admitting: Endocrinology

## 2014-01-08 DIAGNOSIS — E039 Hypothyroidism, unspecified: Secondary | ICD-10-CM | POA: Insufficient documentation

## 2014-01-08 DIAGNOSIS — E063 Autoimmune thyroiditis: Secondary | ICD-10-CM | POA: Insufficient documentation

## 2014-01-08 MED ORDER — LEVOTHYROXINE SODIUM 100 MCG IV SOLR
1200.0000 ug | INTRAVENOUS | Status: DC
  Administered 2014-01-08: 1200 ug via INTRAVENOUS
  Filled 2014-01-08: qty 60

## 2014-01-15 ENCOUNTER — Encounter (HOSPITAL_COMMUNITY)
Admission: RE | Admit: 2014-01-15 | Discharge: 2014-01-15 | Disposition: A | Discharge status: Home / Self Care | Payer: Medicare Other | Source: Ambulatory Visit | Attending: Endocrinology | Admitting: Endocrinology

## 2014-01-15 MED ORDER — LEVOTHYROXINE SODIUM 100 MCG IV SOLR
1200.0000 ug | INTRAVENOUS | Status: DC
  Administered 2014-01-15: 1200 ug via INTRAVENOUS
  Filled 2014-01-15: qty 60

## 2014-01-21 ENCOUNTER — Other Ambulatory Visit (HOSPITAL_COMMUNITY): Payer: Self-pay | Admitting: *Deleted

## 2014-01-22 ENCOUNTER — Inpatient Hospital Stay (HOSPITAL_COMMUNITY): Admission: RE | Admit: 2014-01-22 | Payer: Medicare Other | Source: Ambulatory Visit

## 2014-01-25 ENCOUNTER — Other Ambulatory Visit (HOSPITAL_COMMUNITY): Payer: Self-pay | Admitting: *Deleted

## 2014-01-29 ENCOUNTER — Inpatient Hospital Stay (HOSPITAL_COMMUNITY): Admission: RE | Admit: 2014-01-29 | Payer: Medicare Other | Source: Ambulatory Visit

## 2014-02-04 ENCOUNTER — Other Ambulatory Visit (HOSPITAL_COMMUNITY): Payer: Self-pay | Admitting: *Deleted

## 2014-02-05 ENCOUNTER — Encounter (HOSPITAL_COMMUNITY)
Admission: RE | Admit: 2014-02-05 | Discharge: 2014-02-05 | Disposition: A | Discharge status: Home / Self Care | Payer: Medicare Other | Source: Ambulatory Visit | Attending: Endocrinology | Admitting: Endocrinology

## 2014-02-05 DIAGNOSIS — E063 Autoimmune thyroiditis: Secondary | ICD-10-CM | POA: Insufficient documentation

## 2014-02-05 DIAGNOSIS — E039 Hypothyroidism, unspecified: Secondary | ICD-10-CM | POA: Insufficient documentation

## 2014-02-05 DIAGNOSIS — K9089 Other intestinal malabsorption: Secondary | ICD-10-CM | POA: Insufficient documentation

## 2014-02-05 MED ORDER — LEVOTHYROXINE SODIUM 100 MCG IV SOLR
1200.0000 ug | INTRAVENOUS | Status: DC
  Administered 2014-02-05: 1200 ug via INTRAVENOUS
  Filled 2014-02-05: qty 60

## 2014-02-12 ENCOUNTER — Encounter (HOSPITAL_COMMUNITY)
Admission: RE | Admit: 2014-02-12 | Discharge: 2014-02-12 | Disposition: A | Discharge status: Home / Self Care | Payer: Medicare Other | Source: Ambulatory Visit | Attending: Endocrinology | Admitting: Endocrinology

## 2014-02-12 DIAGNOSIS — E039 Hypothyroidism, unspecified: Secondary | ICD-10-CM | POA: Diagnosis not present

## 2014-02-12 MED ORDER — LEVOTHYROXINE SODIUM 100 MCG IV SOLR
1200.0000 ug | INTRAVENOUS | Status: DC
  Administered 2014-02-12: 1200 ug via INTRAVENOUS
  Filled 2014-02-12: qty 60

## 2014-02-19 ENCOUNTER — Inpatient Hospital Stay (HOSPITAL_COMMUNITY): Admission: RE | Admit: 2014-02-19 | Payer: Medicare Other | Source: Ambulatory Visit

## 2014-02-26 ENCOUNTER — Encounter (HOSPITAL_COMMUNITY)
Admission: RE | Admit: 2014-02-26 | Discharge: 2014-02-26 | Disposition: A | Discharge status: Home / Self Care | Payer: Medicare Other | Source: Ambulatory Visit | Attending: Endocrinology | Admitting: Endocrinology

## 2014-02-26 DIAGNOSIS — K9089 Other intestinal malabsorption: Secondary | ICD-10-CM | POA: Insufficient documentation

## 2014-02-26 DIAGNOSIS — E063 Autoimmune thyroiditis: Secondary | ICD-10-CM | POA: Insufficient documentation

## 2014-02-26 DIAGNOSIS — E039 Hypothyroidism, unspecified: Secondary | ICD-10-CM | POA: Diagnosis not present

## 2014-02-26 MED ORDER — LEVOTHYROXINE SODIUM 100 MCG IV SOLR
1200.0000 ug | INTRAVENOUS | Status: DC
  Administered 2014-02-26: 1200 ug via INTRAVENOUS
  Filled 2014-02-26: qty 60

## 2014-03-04 ENCOUNTER — Other Ambulatory Visit (HOSPITAL_COMMUNITY): Payer: Self-pay | Admitting: *Deleted

## 2014-03-05 ENCOUNTER — Encounter (HOSPITAL_COMMUNITY): Payer: Medicare Other

## 2014-03-12 ENCOUNTER — Encounter (HOSPITAL_COMMUNITY)
Admission: RE | Admit: 2014-03-12 | Discharge: 2014-03-12 | Disposition: A | Discharge status: Home / Self Care | Payer: Medicare Other | Source: Ambulatory Visit | Attending: Endocrinology | Admitting: Endocrinology

## 2014-03-12 DIAGNOSIS — E063 Autoimmune thyroiditis: Secondary | ICD-10-CM | POA: Diagnosis not present

## 2014-03-12 DIAGNOSIS — E039 Hypothyroidism, unspecified: Secondary | ICD-10-CM | POA: Diagnosis not present

## 2014-03-12 MED ORDER — LEVOTHYROXINE SODIUM 100 MCG IV SOLR
1200.0000 ug | INTRAVENOUS | Status: DC
  Administered 2014-03-12: 1200 ug via INTRAVENOUS
  Filled 2014-03-12: qty 60

## 2014-03-19 ENCOUNTER — Inpatient Hospital Stay (HOSPITAL_COMMUNITY): Admission: RE | Admit: 2014-03-19 | Payer: Medicare Other | Source: Ambulatory Visit

## 2014-03-26 ENCOUNTER — Encounter (HOSPITAL_COMMUNITY)
Admission: RE | Admit: 2014-03-26 | Discharge: 2014-03-26 | Disposition: A | Discharge status: Home / Self Care | Payer: Medicare Other | Source: Ambulatory Visit | Attending: Endocrinology | Admitting: Endocrinology

## 2014-03-26 DIAGNOSIS — E039 Hypothyroidism, unspecified: Secondary | ICD-10-CM | POA: Diagnosis not present

## 2014-03-26 MED ORDER — LEVOTHYROXINE SODIUM 100 MCG IV SOLR
1200.0000 ug | INTRAVENOUS | Status: DC
  Administered 2014-03-26: 1200 ug via INTRAVENOUS
  Filled 2014-03-26: qty 60

## 2014-04-01 ENCOUNTER — Other Ambulatory Visit (HOSPITAL_COMMUNITY): Payer: Self-pay | Admitting: *Deleted

## 2014-04-02 ENCOUNTER — Encounter (HOSPITAL_COMMUNITY)
Admission: RE | Admit: 2014-04-02 | Discharge: 2014-04-02 | Disposition: A | Discharge status: Home / Self Care | Payer: Medicare Other | Source: Ambulatory Visit | Attending: Endocrinology | Admitting: Endocrinology

## 2014-04-02 DIAGNOSIS — E039 Hypothyroidism, unspecified: Secondary | ICD-10-CM | POA: Diagnosis not present

## 2014-04-02 MED ORDER — LEVOTHYROXINE SODIUM 100 MCG IV SOLR
1200.0000 ug | Freq: Every day | INTRAVENOUS | Status: AC
  Administered 2014-04-02: 1200 ug via INTRAVENOUS
  Filled 2014-04-02: qty 60

## 2014-04-02 MED ORDER — LEVOTHYROXINE SODIUM 500 MCG IV SOLR
1200.0000 ug | Freq: Once | INTRAVENOUS | Status: DC
  Filled 2014-04-02: qty 12

## 2014-04-09 ENCOUNTER — Encounter (HOSPITAL_COMMUNITY): Payer: Medicare Other

## 2014-04-16 ENCOUNTER — Encounter (HOSPITAL_COMMUNITY)
Admission: RE | Admit: 2014-04-16 | Discharge: 2014-04-16 | Disposition: A | Discharge status: Home / Self Care | Payer: Medicare Other | Source: Ambulatory Visit | Attending: Endocrinology | Admitting: Endocrinology

## 2014-04-16 DIAGNOSIS — E063 Autoimmune thyroiditis: Secondary | ICD-10-CM | POA: Insufficient documentation

## 2014-04-16 DIAGNOSIS — E039 Hypothyroidism, unspecified: Secondary | ICD-10-CM | POA: Diagnosis not present

## 2014-04-16 MED ORDER — LEVOTHYROXINE SODIUM 100 MCG IV SOLR
1200.0000 ug | INTRAVENOUS | Status: DC
  Administered 2014-04-16: 1200 ug via INTRAVENOUS
  Filled 2014-04-16: qty 60

## 2014-04-23 ENCOUNTER — Encounter (HOSPITAL_COMMUNITY)
Admission: RE | Admit: 2014-04-23 | Discharge: 2014-04-23 | Disposition: A | Discharge status: Home / Self Care | Payer: Medicare Other | Source: Ambulatory Visit | Attending: Endocrinology | Admitting: Endocrinology

## 2014-04-23 DIAGNOSIS — E039 Hypothyroidism, unspecified: Secondary | ICD-10-CM | POA: Diagnosis not present

## 2014-04-23 MED ORDER — LEVOTHYROXINE SODIUM 500 MCG IV SOLR
1200.0000 ug | INTRAVENOUS | Status: DC
  Administered 2014-04-23: 1200 ug via INTRAVENOUS
  Filled 2014-04-23: qty 12

## 2014-04-30 ENCOUNTER — Encounter (HOSPITAL_COMMUNITY): Payer: Medicare Other

## 2014-04-30 IMAGING — CT CT CTA ABD/PEL W/CM AND/OR W/O CM
2 of 7 series · 5 of 46 positions shown, 10 images · IV contrast (omnipaque)
Comparison: None.

CLINICAL DATA: Left-sided abdominal pain

CT ANGIOGRAPHY ABDOMEN AND PELVIS WITH CONTRAST AND WITHOUT
CONTRAST
Contrast:  100 ml Omnipaque three fifty

[Series 4: abdangio 3.0 b30f · axial · 0.61mm/px · z∈[-348,-120]mm · 3 of 152 slices shown, 7 images]
[im 38/152  soft-tissue]
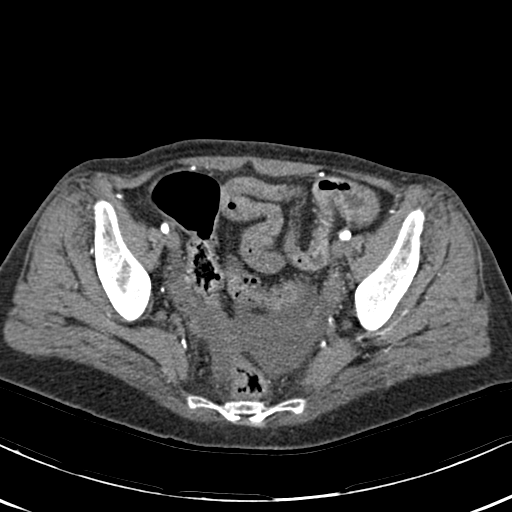
[im 38/152  lung]
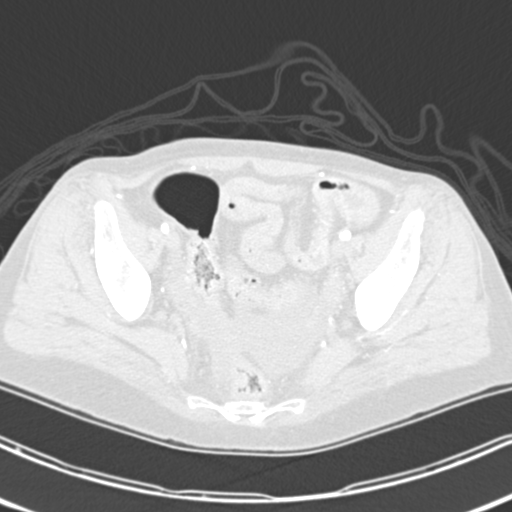
[im 38/152  bone]
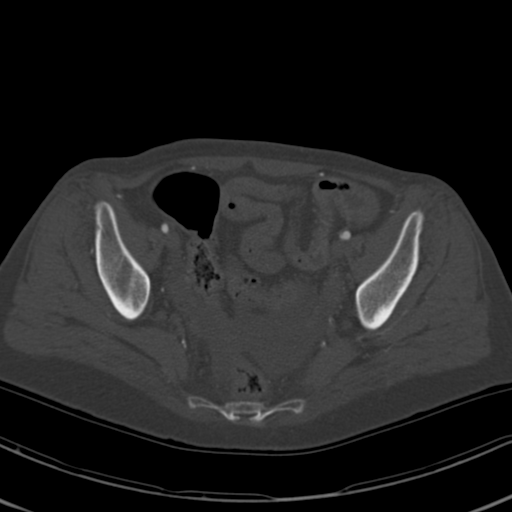
[im 76/152  soft-tissue]
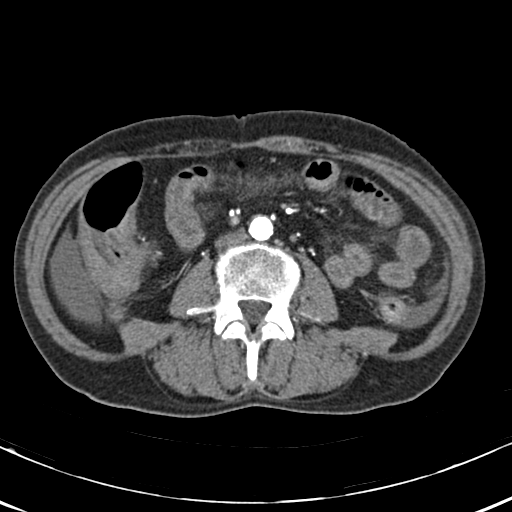
[im 76/152  lung]
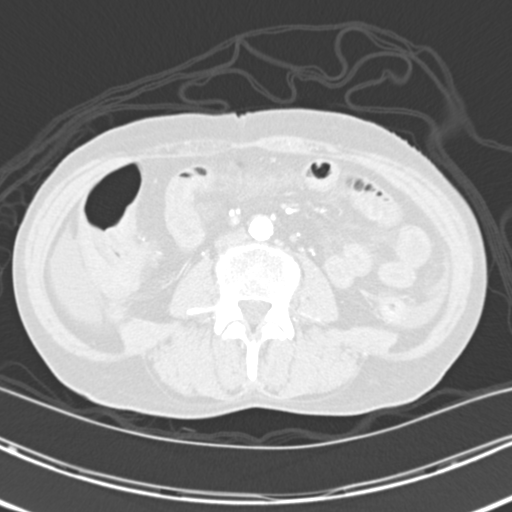
[im 114/152  soft-tissue]
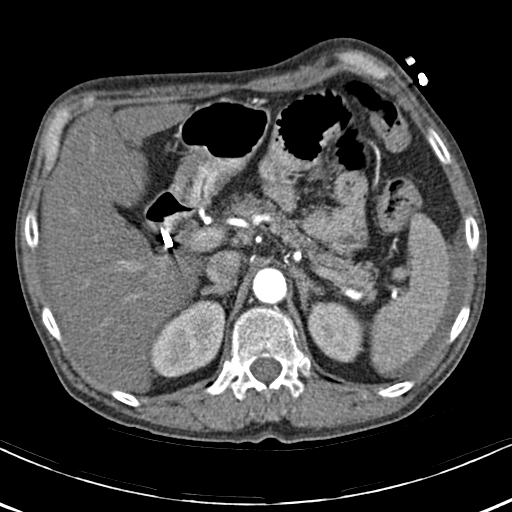
[im 114/152  lung]
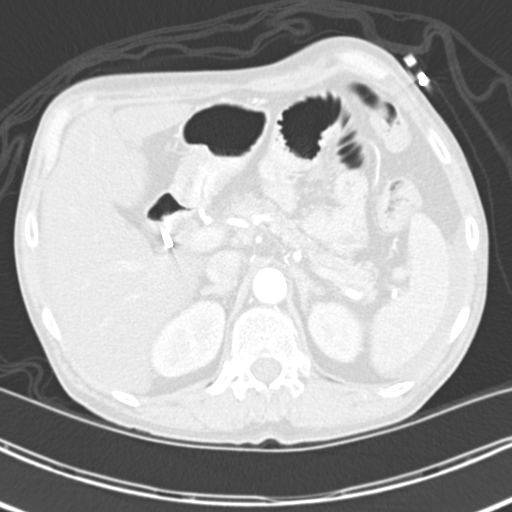

[Series 6: mpr cor post contrast · coronal · 0.55mm/px · 2 of 82 slices shown, 3 images]
[im 28/82  soft-tissue]
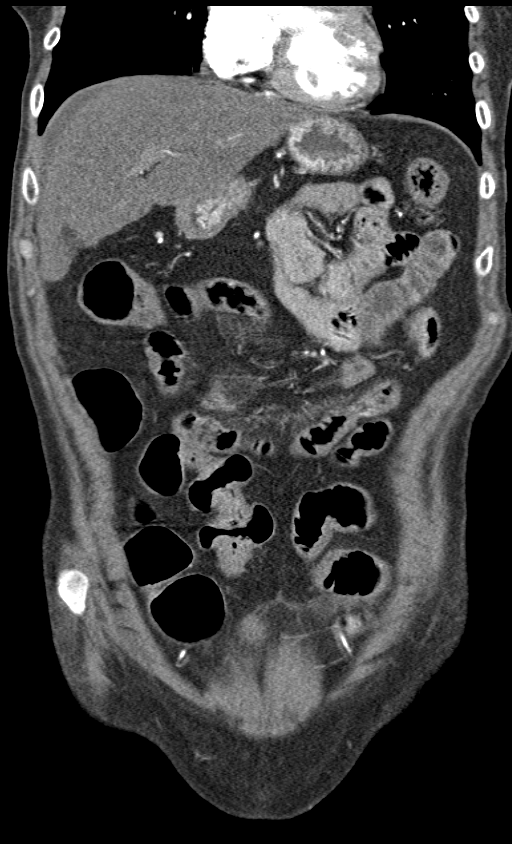
[im 28/82  bone]
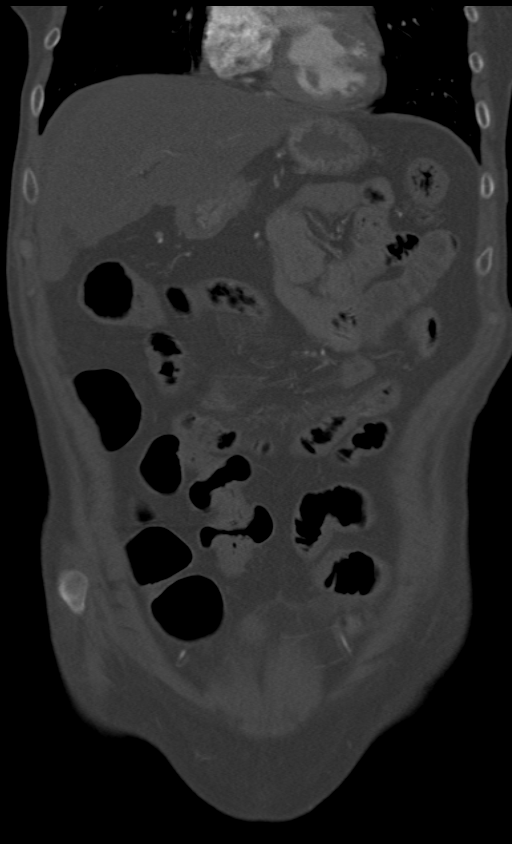
[im 55/82  soft-tissue]
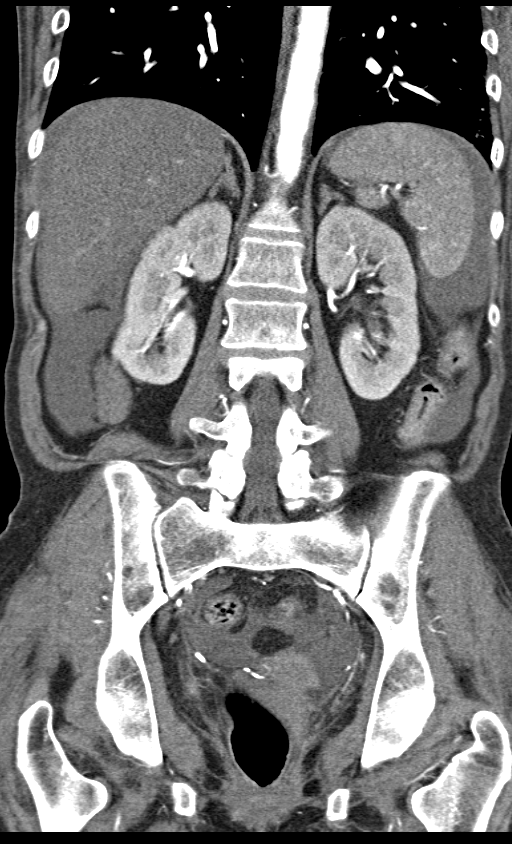

[5 of 46 positions shown; findings below may reference images not displayed]

FINDINGS: Lung bases are predominately clear with mild areas of
scarring along the periphery posteriorly.  Heart size within normal
limits.

There is scattered atherosclerotic disease of the aorta and branch
vessels including advanced atherosclerotic plaque of the aorta at
the level of the IMA origin which results in luminal narrowing of
50%.  Poststenotic ectasia at 1.8 cm.  The aorta measures 1.5 cm
above the level of the stenosis.

The celiac axis, SMA, single renal arteries, and IMA remain patent.
There is advanced atherosclerotic disease of the bilateral common
iliac arteries without aneurysmal dilatation.  Iliac branch vessels
remain patent.

Organ evaluation is limited in the arterial and delayed phases.
Allowing for this, low attenuation of the liver suggests fatty
infiltration.  Unremarkable spleen, pancreas, adrenal glands.

Absent gallbladder.  Mild biliary ductal dilatation is nonspecific.

Symmetric renal enhancement.  No hydronephrosis or hydroureter.

No CT evidence for colitis.  Mild colonic diverticulosis.  There
are several thickened small bowel loops with mucosal
hyperenhancement (see series 4 image 118 as index).  There is
mesenteric fat stranding and intraperitoneal fluid within the
abdomen pelvis.  No free intraperitoneal air.  No lymphadenopathy.

Bladder wall thickening along the anterior and lateral margins,
somewhat nodular on the right as seen on coronal image 69.
Relative sparing posteriorly.

No acute osseous finding.
IMPRESSION: Advanced atherosclerotic disease of the aorta as above. Branch
vessels remain patent.

Thickened loops of small bowel with mucosal hyperenhancement.  This
is in keeping with a nonspecific enteritis (infectious, ischemic,
and inflammatory considerations).

There is free intraperitoneal fluid which may be reactive.

Hepatic steatosis.

Enhancing bladder wall, somewhat nodular on the right.  Recommend
urinalysis and cystoscopy correlation.

## 2014-05-06 ENCOUNTER — Other Ambulatory Visit (HOSPITAL_COMMUNITY): Payer: Self-pay | Admitting: *Deleted

## 2014-05-07 ENCOUNTER — Encounter (HOSPITAL_COMMUNITY)
Admission: RE | Admit: 2014-05-07 | Discharge: 2014-05-07 | Disposition: A | Discharge status: Home / Self Care | Payer: Medicare Other | Source: Ambulatory Visit | Attending: Endocrinology | Admitting: Endocrinology

## 2014-05-07 DIAGNOSIS — E063 Autoimmune thyroiditis: Secondary | ICD-10-CM | POA: Diagnosis not present

## 2014-05-07 DIAGNOSIS — E039 Hypothyroidism, unspecified: Secondary | ICD-10-CM | POA: Insufficient documentation

## 2014-05-07 MED ORDER — LEVOTHYROXINE SODIUM 500 MCG IV SOLR
1200.0000 ug | INTRAVENOUS | Status: DC
  Administered 2014-05-07: 1200 ug via INTRAVENOUS
  Filled 2014-05-07: qty 12

## 2014-05-14 ENCOUNTER — Encounter (HOSPITAL_COMMUNITY)
Admission: RE | Admit: 2014-05-14 | Discharge: 2014-05-14 | Disposition: A | Discharge status: Home / Self Care | Payer: Medicare Other | Source: Ambulatory Visit | Attending: Endocrinology | Admitting: Endocrinology

## 2014-05-14 DIAGNOSIS — E039 Hypothyroidism, unspecified: Secondary | ICD-10-CM | POA: Diagnosis not present

## 2014-05-14 MED ORDER — LEVOTHYROXINE SODIUM 100 MCG IV SOLR
1200.0000 ug | INTRAVENOUS | Status: DC
  Administered 2014-05-14: 1200 ug via INTRAVENOUS
  Filled 2014-05-14: qty 60

## 2014-05-20 ENCOUNTER — Other Ambulatory Visit (HOSPITAL_COMMUNITY): Payer: Self-pay | Admitting: *Deleted

## 2014-05-21 ENCOUNTER — Encounter (HOSPITAL_COMMUNITY): Payer: Medicare Other

## 2014-05-28 ENCOUNTER — Encounter (HOSPITAL_COMMUNITY): Payer: Medicare Other

## 2014-06-04 ENCOUNTER — Inpatient Hospital Stay (HOSPITAL_COMMUNITY): Admission: RE | Admit: 2014-06-04 | Payer: Medicare Other | Source: Ambulatory Visit

## 2014-06-11 ENCOUNTER — Encounter (HOSPITAL_COMMUNITY)
Admission: RE | Admit: 2014-06-11 | Discharge: 2014-06-11 | Disposition: A | Discharge status: Home / Self Care | Payer: Medicare Other | Source: Ambulatory Visit | Attending: Endocrinology | Admitting: Endocrinology

## 2014-06-11 DIAGNOSIS — E039 Hypothyroidism, unspecified: Secondary | ICD-10-CM | POA: Diagnosis present

## 2014-06-11 DIAGNOSIS — E063 Autoimmune thyroiditis: Secondary | ICD-10-CM | POA: Insufficient documentation

## 2014-06-11 MED ORDER — LEVOTHYROXINE SODIUM 100 MCG IV SOLR
1100.0000 ug | INTRAVENOUS | Status: DC
  Administered 2014-06-11: 1100 ug via INTRAVENOUS
  Filled 2014-06-11: qty 55

## 2014-06-18 ENCOUNTER — Encounter (HOSPITAL_COMMUNITY)
Admission: RE | Admit: 2014-06-18 | Discharge: 2014-06-18 | Disposition: A | Discharge status: Home / Self Care | Payer: Medicare Other | Source: Ambulatory Visit | Attending: Endocrinology | Admitting: Endocrinology

## 2014-06-18 DIAGNOSIS — E039 Hypothyroidism, unspecified: Secondary | ICD-10-CM | POA: Diagnosis not present

## 2014-06-18 MED ORDER — LEVOTHYROXINE SODIUM 100 MCG IV SOLR
1100.0000 ug | INTRAVENOUS | Status: DC
  Administered 2014-06-18: 1100 ug via INTRAVENOUS
  Filled 2014-06-18: qty 55

## 2014-06-25 ENCOUNTER — Encounter (HOSPITAL_COMMUNITY)
Admission: RE | Admit: 2014-06-25 | Discharge: 2014-06-25 | Disposition: A | Discharge status: Home / Self Care | Payer: Medicare Other | Source: Ambulatory Visit | Attending: Endocrinology | Admitting: Endocrinology

## 2014-06-25 DIAGNOSIS — E039 Hypothyroidism, unspecified: Secondary | ICD-10-CM | POA: Diagnosis not present

## 2014-06-25 MED ORDER — LEVOTHYROXINE SODIUM 100 MCG IV SOLR
1100.0000 ug | INTRAVENOUS | Status: DC
  Administered 2014-06-25: 1100 ug via INTRAVENOUS
  Filled 2014-06-25: qty 55

## 2014-07-02 ENCOUNTER — Encounter (HOSPITAL_COMMUNITY): Payer: Medicare Other

## 2014-07-09 ENCOUNTER — Encounter (HOSPITAL_COMMUNITY): Payer: Medicare Other

## 2014-07-16 ENCOUNTER — Encounter (HOSPITAL_COMMUNITY)
Admission: RE | Admit: 2014-07-16 | Discharge: 2014-07-16 | Disposition: A | Discharge status: Home / Self Care | Payer: Medicare Other | Source: Ambulatory Visit | Attending: Endocrinology | Admitting: Endocrinology

## 2014-07-16 DIAGNOSIS — E039 Hypothyroidism, unspecified: Secondary | ICD-10-CM | POA: Insufficient documentation

## 2014-07-16 DIAGNOSIS — E063 Autoimmune thyroiditis: Secondary | ICD-10-CM | POA: Insufficient documentation

## 2014-07-16 MED ORDER — LEVOTHYROXINE SODIUM 100 MCG IV SOLR
1100.0000 ug | INTRAVENOUS | Status: DC
  Administered 2014-07-16: 1100 ug via INTRAVENOUS
  Filled 2014-07-16: qty 55

## 2014-07-23 ENCOUNTER — Encounter (HOSPITAL_COMMUNITY)
Admission: RE | Admit: 2014-07-23 | Discharge: 2014-07-23 | Disposition: A | Discharge status: Home / Self Care | Payer: Medicare Other | Source: Ambulatory Visit | Attending: Endocrinology | Admitting: Endocrinology

## 2014-07-23 DIAGNOSIS — E039 Hypothyroidism, unspecified: Secondary | ICD-10-CM | POA: Diagnosis not present

## 2014-07-23 MED ORDER — LEVOTHYROXINE SODIUM 100 MCG IV SOLR
1100.0000 ug | INTRAVENOUS | Status: DC
  Administered 2014-07-23: 1100 ug via INTRAVENOUS
  Filled 2014-07-23: qty 55

## 2014-07-29 ENCOUNTER — Other Ambulatory Visit (HOSPITAL_COMMUNITY): Payer: Self-pay | Admitting: *Deleted

## 2014-07-30 ENCOUNTER — Encounter (HOSPITAL_COMMUNITY): Payer: Medicare Other

## 2014-08-06 ENCOUNTER — Encounter (HOSPITAL_COMMUNITY)
Admission: RE | Admit: 2014-08-06 | Discharge: 2014-08-06 | Disposition: A | Discharge status: Home / Self Care | Payer: Medicare Other | Source: Ambulatory Visit | Attending: Endocrinology | Admitting: Endocrinology

## 2014-08-06 DIAGNOSIS — E039 Hypothyroidism, unspecified: Secondary | ICD-10-CM | POA: Diagnosis not present

## 2014-08-06 DIAGNOSIS — E063 Autoimmune thyroiditis: Secondary | ICD-10-CM | POA: Insufficient documentation

## 2014-08-06 MED ORDER — LEVOTHYROXINE SODIUM 100 MCG IV SOLR
1100.0000 ug | INTRAVENOUS | Status: DC
  Administered 2014-08-06: 1100 ug via INTRAVENOUS
  Filled 2014-08-06: qty 55

## 2014-08-13 ENCOUNTER — Encounter (HOSPITAL_COMMUNITY)
Admission: RE | Admit: 2014-08-13 | Discharge: 2014-08-13 | Disposition: A | Discharge status: Home / Self Care | Payer: Medicare Other | Source: Ambulatory Visit | Attending: Endocrinology | Admitting: Endocrinology

## 2014-08-13 DIAGNOSIS — E039 Hypothyroidism, unspecified: Secondary | ICD-10-CM | POA: Diagnosis not present

## 2014-08-13 MED ORDER — LEVOTHYROXINE SODIUM 100 MCG IV SOLR
1100.0000 ug | INTRAVENOUS | Status: DC
  Administered 2014-08-13: 1000 ug via INTRAVENOUS
  Administered 2014-08-13: 100 ug via INTRAVENOUS
  Filled 2014-08-13: qty 55

## 2014-08-13 MED ORDER — SODIUM CHLORIDE 0.9 % IV SOLN
INTRAVENOUS | Status: DC
  Administered 2014-08-13: 11:00:00 via INTRAVENOUS

## 2014-08-20 ENCOUNTER — Inpatient Hospital Stay (HOSPITAL_COMMUNITY): Admission: RE | Admit: 2014-08-20 | Payer: Medicare Other | Source: Ambulatory Visit

## 2014-08-26 ENCOUNTER — Other Ambulatory Visit (HOSPITAL_COMMUNITY): Payer: Self-pay | Admitting: *Deleted

## 2014-08-27 ENCOUNTER — Encounter (HOSPITAL_COMMUNITY)
Admission: RE | Admit: 2014-08-27 | Discharge: 2014-08-27 | Disposition: A | Discharge status: Home / Self Care | Payer: Medicare Other | Source: Ambulatory Visit | Attending: Endocrinology | Admitting: Endocrinology

## 2014-08-27 DIAGNOSIS — E039 Hypothyroidism, unspecified: Secondary | ICD-10-CM | POA: Diagnosis not present

## 2014-08-27 MED ORDER — LEVOTHYROXINE SODIUM 100 MCG IV SOLR
1200.0000 ug | INTRAVENOUS | Status: DC
  Administered 2014-08-27: 1200 ug via INTRAVENOUS
  Filled 2014-08-27: qty 60

## 2014-09-03 ENCOUNTER — Encounter (HOSPITAL_COMMUNITY): Payer: Medicare Other

## 2014-09-10 ENCOUNTER — Encounter (HOSPITAL_COMMUNITY): Payer: Self-pay

## 2014-09-17 ENCOUNTER — Inpatient Hospital Stay (HOSPITAL_COMMUNITY): Admission: RE | Admit: 2014-09-17 | Payer: Self-pay | Source: Ambulatory Visit

## 2014-09-18 ENCOUNTER — Encounter (INDEPENDENT_AMBULATORY_CARE_PROVIDER_SITE_OTHER): Payer: Self-pay | Admitting: *Deleted

## 2014-11-05 ENCOUNTER — Ambulatory Visit (INDEPENDENT_AMBULATORY_CARE_PROVIDER_SITE_OTHER): Payer: Self-pay | Admitting: Internal Medicine

## 2014-11-12 ENCOUNTER — Encounter (HOSPITAL_COMMUNITY)
Admission: RE | Admit: 2014-11-12 | Discharge: 2014-11-12 | Disposition: A | Discharge status: Home / Self Care | Payer: Medicare Other | Source: Ambulatory Visit | Attending: Endocrinology | Admitting: Endocrinology

## 2014-11-12 DIAGNOSIS — E063 Autoimmune thyroiditis: Secondary | ICD-10-CM | POA: Insufficient documentation

## 2014-11-12 DIAGNOSIS — E039 Hypothyroidism, unspecified: Secondary | ICD-10-CM | POA: Insufficient documentation

## 2014-11-12 MED ORDER — LEVOTHYROXINE SODIUM 100 MCG IV SOLR
1200.0000 ug | INTRAVENOUS | Status: DC
  Filled 2014-11-12: qty 60

## 2014-11-12 MED ORDER — LEVOTHYROXINE SODIUM 200 MCG IV SOLR
1200.0000 ug | INTRAVENOUS | Status: DC
  Filled 2014-11-12: qty 30

## 2014-11-12 MED ORDER — LEVOTHYROXINE SODIUM 200 MCG IV SOLR
1200.0000 ug | INTRAVENOUS | Status: DC
  Administered 2014-11-12: 1200 ug via INTRAVENOUS
  Filled 2014-11-12: qty 30

## 2014-11-26 ENCOUNTER — Encounter (HOSPITAL_COMMUNITY)
Admission: RE | Admit: 2014-11-26 | Discharge: 2014-11-26 | Disposition: A | Discharge status: Home / Self Care | Payer: Medicare Other | Source: Ambulatory Visit | Attending: Endocrinology | Admitting: Endocrinology

## 2014-11-26 DIAGNOSIS — E039 Hypothyroidism, unspecified: Secondary | ICD-10-CM | POA: Diagnosis not present

## 2014-11-26 MED ORDER — LEVOTHYROXINE SODIUM 100 MCG IV SOLR
1100.0000 ug | INTRAVENOUS | Status: DC
  Administered 2014-11-26: 1100 ug via INTRAVENOUS
  Filled 2014-11-26: qty 55

## 2014-11-26 MED ORDER — LEVOTHYROXINE SODIUM 200 MCG IV SOLR: 1200.0000 ug | INTRAVENOUS | Status: DC

## 2014-12-02 ENCOUNTER — Other Ambulatory Visit (HOSPITAL_COMMUNITY): Payer: Self-pay | Admitting: *Deleted

## 2014-12-03 ENCOUNTER — Encounter (HOSPITAL_COMMUNITY): Payer: Medicare Other

## 2014-12-10 ENCOUNTER — Encounter (HOSPITAL_COMMUNITY)
Admission: RE | Admit: 2014-12-10 | Discharge: 2014-12-10 | Disposition: A | Discharge status: Home / Self Care | Payer: Medicare Other | Source: Ambulatory Visit | Attending: Endocrinology | Admitting: Endocrinology

## 2014-12-10 DIAGNOSIS — E039 Hypothyroidism, unspecified: Secondary | ICD-10-CM | POA: Diagnosis present

## 2014-12-10 DIAGNOSIS — E063 Autoimmune thyroiditis: Secondary | ICD-10-CM | POA: Insufficient documentation

## 2014-12-10 MED ORDER — LEVOTHYROXINE SODIUM 100 MCG IV SOLR
1100.0000 ug | INTRAVENOUS | Status: DC
  Administered 2014-12-10: 1100 ug via INTRAVENOUS
  Filled 2014-12-10: qty 55

## 2014-12-10 MED ORDER — SODIUM CHLORIDE 0.9 % IV SOLN
INTRAVENOUS | Status: DC
  Administered 2014-12-10: 250 mL via INTRAVENOUS

## 2014-12-17 ENCOUNTER — Encounter (HOSPITAL_COMMUNITY)
Admission: RE | Admit: 2014-12-17 | Discharge: 2014-12-17 | Disposition: A | Discharge status: Home / Self Care | Payer: Medicare Other | Source: Ambulatory Visit | Attending: Endocrinology | Admitting: Endocrinology

## 2014-12-17 DIAGNOSIS — E039 Hypothyroidism, unspecified: Secondary | ICD-10-CM | POA: Diagnosis not present

## 2014-12-17 MED ORDER — LEVOTHYROXINE SODIUM 100 MCG IV SOLR
1100.0000 ug | INTRAVENOUS | Status: DC
  Administered 2014-12-17: 1100 ug via INTRAVENOUS
  Filled 2014-12-17: qty 55

## 2014-12-17 MED ORDER — SODIUM CHLORIDE 0.9 % IV SOLN: INTRAVENOUS | Status: DC

## 2014-12-24 ENCOUNTER — Inpatient Hospital Stay (HOSPITAL_COMMUNITY): Admission: RE | Admit: 2014-12-24 | Payer: Medicare Other | Source: Ambulatory Visit

## 2014-12-31 ENCOUNTER — Encounter (HOSPITAL_COMMUNITY)
Admission: RE | Admit: 2014-12-31 | Discharge: 2014-12-31 | Disposition: A | Discharge status: Home / Self Care | Payer: Medicare Other | Source: Ambulatory Visit | Attending: Endocrinology | Admitting: Endocrinology

## 2014-12-31 DIAGNOSIS — E039 Hypothyroidism, unspecified: Secondary | ICD-10-CM | POA: Diagnosis not present

## 2014-12-31 MED ORDER — LEVOTHYROXINE SODIUM 100 MCG IV SOLR
1100.0000 ug | INTRAVENOUS | Status: DC
  Administered 2014-12-31: 1100 ug via INTRAVENOUS
  Filled 2014-12-31: qty 55

## 2014-12-31 MED ORDER — SODIUM CHLORIDE 0.9 % IV SOLN: INTRAVENOUS | Status: DC

## 2015-01-07 ENCOUNTER — Encounter (HOSPITAL_COMMUNITY): Payer: Medicare Other

## 2015-01-14 ENCOUNTER — Encounter (HOSPITAL_COMMUNITY)
Admission: RE | Admit: 2015-01-14 | Discharge: 2015-01-14 | Disposition: A | Discharge status: Home / Self Care | Payer: Medicare Other | Source: Ambulatory Visit | Attending: Endocrinology | Admitting: Endocrinology

## 2015-01-14 DIAGNOSIS — E039 Hypothyroidism, unspecified: Secondary | ICD-10-CM | POA: Diagnosis present

## 2015-01-14 DIAGNOSIS — E063 Autoimmune thyroiditis: Secondary | ICD-10-CM | POA: Insufficient documentation

## 2015-01-14 MED ORDER — SODIUM CHLORIDE 0.9 % IV SOLN
INTRAVENOUS | Status: DC
  Administered 2015-01-14: 11:00:00 via INTRAVENOUS

## 2015-01-14 MED ORDER — LEVOTHYROXINE SODIUM 100 MCG IV SOLR
1100.0000 ug | INTRAVENOUS | Status: DC
  Administered 2015-01-14: 1100 ug via INTRAVENOUS
  Filled 2015-01-14: qty 55

## 2015-01-20 ENCOUNTER — Other Ambulatory Visit (HOSPITAL_COMMUNITY): Payer: Self-pay | Admitting: *Deleted

## 2015-01-21 ENCOUNTER — Encounter (HOSPITAL_COMMUNITY): Payer: Medicare Other

## 2015-01-28 ENCOUNTER — Encounter (HOSPITAL_COMMUNITY)
Admission: RE | Admit: 2015-01-28 | Discharge: 2015-01-28 | Disposition: A | Discharge status: Home / Self Care | Payer: Medicare Other | Source: Ambulatory Visit | Attending: Endocrinology | Admitting: Endocrinology

## 2015-01-28 DIAGNOSIS — E039 Hypothyroidism, unspecified: Secondary | ICD-10-CM | POA: Diagnosis not present

## 2015-01-28 MED ORDER — LEVOTHYROXINE SODIUM 100 MCG IV SOLR
1100.0000 ug | INTRAVENOUS | Status: DC
  Administered 2015-01-28: 1100 ug via INTRAVENOUS
  Filled 2015-01-28: qty 55

## 2015-01-28 MED ORDER — SODIUM CHLORIDE 0.9 % IV SOLN
INTRAVENOUS | Status: DC
  Administered 2015-01-28: 10:00:00 via INTRAVENOUS

## 2015-02-04 ENCOUNTER — Encounter (HOSPITAL_COMMUNITY)
Admission: RE | Admit: 2015-02-04 | Discharge: 2015-02-04 | Disposition: A | Discharge status: Home / Self Care | Payer: Medicare Other | Source: Ambulatory Visit | Attending: Endocrinology | Admitting: Endocrinology

## 2015-02-04 DIAGNOSIS — E039 Hypothyroidism, unspecified: Secondary | ICD-10-CM | POA: Diagnosis not present

## 2015-02-04 MED ORDER — LEVOTHYROXINE SODIUM 100 MCG IV SOLR
1100.0000 ug | INTRAVENOUS | Status: DC
  Administered 2015-02-04: 1100 ug via INTRAVENOUS
  Filled 2015-02-04: qty 55

## 2015-02-04 MED ORDER — SODIUM CHLORIDE 0.9 % IV SOLN: INTRAVENOUS | Status: DC

## 2015-02-11 ENCOUNTER — Encounter (HOSPITAL_COMMUNITY)
Admission: RE | Admit: 2015-02-11 | Discharge: 2015-02-11 | Disposition: A | Discharge status: Home / Self Care | Payer: Medicare Other | Source: Ambulatory Visit | Attending: Endocrinology | Admitting: Endocrinology

## 2015-02-11 DIAGNOSIS — E063 Autoimmune thyroiditis: Secondary | ICD-10-CM | POA: Diagnosis not present

## 2015-02-11 DIAGNOSIS — E039 Hypothyroidism, unspecified: Secondary | ICD-10-CM | POA: Insufficient documentation

## 2015-02-11 MED ORDER — LEVOTHYROXINE SODIUM 100 MCG IV SOLR
1100.0000 ug | INTRAVENOUS | Status: DC
  Administered 2015-02-11: 1100 ug via INTRAVENOUS
  Filled 2015-02-11: qty 55

## 2015-02-11 MED ORDER — SODIUM CHLORIDE 0.9 % IV SOLN: INTRAVENOUS | Status: DC

## 2015-02-18 ENCOUNTER — Encounter (HOSPITAL_COMMUNITY)
Admission: RE | Admit: 2015-02-18 | Discharge: 2015-02-18 | Disposition: A | Discharge status: Home / Self Care | Payer: Medicare Other | Source: Ambulatory Visit | Attending: Endocrinology | Admitting: Endocrinology

## 2015-02-18 DIAGNOSIS — E039 Hypothyroidism, unspecified: Secondary | ICD-10-CM | POA: Diagnosis not present

## 2015-02-18 MED ORDER — LEVOTHYROXINE SODIUM 100 MCG IV SOLR
1100.0000 ug | INTRAVENOUS | Status: DC
  Administered 2015-02-18: 1100 ug via INTRAVENOUS
  Filled 2015-02-18: qty 55

## 2015-02-25 ENCOUNTER — Encounter (HOSPITAL_COMMUNITY)
Admission: RE | Admit: 2015-02-25 | Payer: Medicare Other | Source: Ambulatory Visit | Attending: Endocrinology | Admitting: Endocrinology

## 2015-03-04 ENCOUNTER — Inpatient Hospital Stay (HOSPITAL_COMMUNITY): Admission: RE | Admit: 2015-03-04 | Payer: Medicare Other | Source: Ambulatory Visit

## 2015-03-19 ENCOUNTER — Encounter (HOSPITAL_COMMUNITY)
Admission: RE | Admit: 2015-03-19 | Discharge: 2015-03-19 | Disposition: A | Discharge status: Home / Self Care | Payer: Medicare Other | Source: Ambulatory Visit | Attending: Endocrinology | Admitting: Endocrinology

## 2015-03-19 DIAGNOSIS — E039 Hypothyroidism, unspecified: Secondary | ICD-10-CM | POA: Diagnosis not present

## 2015-03-19 DIAGNOSIS — E063 Autoimmune thyroiditis: Secondary | ICD-10-CM | POA: Insufficient documentation

## 2015-03-19 MED ORDER — LEVOTHYROXINE SODIUM 100 MCG IV SOLR
1100.0000 ug | INTRAVENOUS | Status: DC
  Administered 2015-03-19: 1100 ug via INTRAVENOUS
  Filled 2015-03-19: qty 55

## 2015-03-25 ENCOUNTER — Encounter (HOSPITAL_COMMUNITY)
Admission: RE | Admit: 2015-03-25 | Discharge: 2015-03-25 | Disposition: A | Discharge status: Home / Self Care | Payer: Medicare Other | Source: Ambulatory Visit | Attending: Endocrinology | Admitting: Endocrinology

## 2015-03-25 DIAGNOSIS — E039 Hypothyroidism, unspecified: Secondary | ICD-10-CM | POA: Diagnosis not present

## 2015-03-25 MED ORDER — LEVOTHYROXINE SODIUM 100 MCG IV SOLR
1100.0000 ug | INTRAVENOUS | Status: DC
  Administered 2015-03-25: 1100 ug via INTRAVENOUS
  Filled 2015-03-25: qty 55

## 2015-03-26 DIAGNOSIS — E039 Hypothyroidism, unspecified: Secondary | ICD-10-CM | POA: Diagnosis not present

## 2015-03-31 ENCOUNTER — Other Ambulatory Visit (HOSPITAL_COMMUNITY): Payer: Self-pay | Admitting: *Deleted

## 2015-04-01 ENCOUNTER — Inpatient Hospital Stay (HOSPITAL_COMMUNITY): Admission: RE | Admit: 2015-04-01 | Payer: Medicare Other | Source: Ambulatory Visit

## 2015-04-08 ENCOUNTER — Encounter (HOSPITAL_COMMUNITY)
Admission: RE | Admit: 2015-04-08 | Discharge: 2015-04-08 | Disposition: A | Discharge status: Home / Self Care | Payer: Medicare Other | Source: Ambulatory Visit | Attending: Endocrinology | Admitting: Endocrinology

## 2015-04-08 DIAGNOSIS — E063 Autoimmune thyroiditis: Secondary | ICD-10-CM | POA: Insufficient documentation

## 2015-04-08 DIAGNOSIS — K909 Intestinal malabsorption, unspecified: Secondary | ICD-10-CM | POA: Diagnosis not present

## 2015-04-08 DIAGNOSIS — E039 Hypothyroidism, unspecified: Secondary | ICD-10-CM | POA: Diagnosis not present

## 2015-04-08 MED ORDER — LEVOTHYROXINE SODIUM 100 MCG IV SOLR
1100.0000 ug | INTRAVENOUS | Status: DC
  Administered 2015-04-08: 1100 ug via INTRAVENOUS
  Filled 2015-04-08: qty 55

## 2015-04-15 ENCOUNTER — Encounter (HOSPITAL_COMMUNITY)
Admission: RE | Admit: 2015-04-15 | Discharge: 2015-04-15 | Disposition: A | Discharge status: Home / Self Care | Payer: Medicare Other | Source: Ambulatory Visit | Attending: Endocrinology | Admitting: Endocrinology

## 2015-04-15 DIAGNOSIS — E039 Hypothyroidism, unspecified: Secondary | ICD-10-CM | POA: Diagnosis not present

## 2015-04-15 MED ORDER — LEVOTHYROXINE SODIUM 100 MCG IV SOLR
1100.0000 ug | INTRAVENOUS | Status: DC
  Administered 2015-04-15: 1100 ug via INTRAVENOUS
  Filled 2015-04-15: qty 55

## 2015-04-22 ENCOUNTER — Encounter (HOSPITAL_COMMUNITY)
Admission: RE | Admit: 2015-04-22 | Discharge: 2015-04-22 | Disposition: A | Discharge status: Home / Self Care | Payer: Medicare Other | Source: Ambulatory Visit | Attending: Endocrinology | Admitting: Endocrinology

## 2015-04-22 DIAGNOSIS — E039 Hypothyroidism, unspecified: Secondary | ICD-10-CM | POA: Diagnosis not present

## 2015-04-22 MED ORDER — LEVOTHYROXINE SODIUM 100 MCG IV SOLR
1100.0000 ug | INTRAVENOUS | Status: DC
  Administered 2015-04-22: 1100 ug via INTRAVENOUS
  Filled 2015-04-22: qty 55

## 2015-04-29 ENCOUNTER — Encounter (HOSPITAL_COMMUNITY): Payer: Medicare Other

## 2015-05-05 ENCOUNTER — Other Ambulatory Visit (HOSPITAL_COMMUNITY): Payer: Self-pay | Admitting: *Deleted

## 2015-05-06 ENCOUNTER — Encounter (HOSPITAL_COMMUNITY)
Admission: RE | Admit: 2015-05-06 | Discharge: 2015-05-06 | Disposition: A | Discharge status: Home / Self Care | Payer: Medicare Other | Source: Ambulatory Visit | Attending: Endocrinology | Admitting: Endocrinology

## 2015-05-06 DIAGNOSIS — E063 Autoimmune thyroiditis: Secondary | ICD-10-CM | POA: Diagnosis not present

## 2015-05-06 DIAGNOSIS — K909 Intestinal malabsorption, unspecified: Secondary | ICD-10-CM | POA: Diagnosis not present

## 2015-05-06 DIAGNOSIS — E039 Hypothyroidism, unspecified: Secondary | ICD-10-CM | POA: Insufficient documentation

## 2015-05-06 MED ORDER — LEVOTHYROXINE SODIUM 100 MCG IV SOLR
1000.0000 ug | INTRAVENOUS | Status: DC
  Administered 2015-05-06: 1000 ug via INTRAVENOUS
  Filled 2015-05-06: qty 50

## 2015-05-13 ENCOUNTER — Encounter (HOSPITAL_COMMUNITY): Payer: Medicare Other

## 2015-05-20 ENCOUNTER — Encounter (HOSPITAL_COMMUNITY)
Admission: RE | Admit: 2015-05-20 | Discharge: 2015-05-20 | Disposition: A | Discharge status: Home / Self Care | Payer: Medicare Other | Source: Ambulatory Visit | Attending: Endocrinology | Admitting: Endocrinology

## 2015-05-20 DIAGNOSIS — K909 Intestinal malabsorption, unspecified: Secondary | ICD-10-CM | POA: Diagnosis not present

## 2015-05-20 DIAGNOSIS — E063 Autoimmune thyroiditis: Secondary | ICD-10-CM | POA: Insufficient documentation

## 2015-05-20 DIAGNOSIS — E039 Hypothyroidism, unspecified: Secondary | ICD-10-CM | POA: Insufficient documentation

## 2015-05-20 MED ORDER — LEVOTHYROXINE SODIUM 100 MCG IV SOLR
1000.0000 ug | INTRAVENOUS | Status: DC
  Administered 2015-05-20: 1000 ug via INTRAVENOUS
  Filled 2015-05-20: qty 50

## 2015-05-27 ENCOUNTER — Encounter (HOSPITAL_COMMUNITY)
Admission: RE | Admit: 2015-05-27 | Discharge: 2015-05-27 | Disposition: A | Discharge status: Home / Self Care | Payer: Medicare Other | Source: Ambulatory Visit | Attending: Endocrinology | Admitting: Endocrinology

## 2015-05-27 DIAGNOSIS — E039 Hypothyroidism, unspecified: Secondary | ICD-10-CM | POA: Insufficient documentation

## 2015-05-27 DIAGNOSIS — E063 Autoimmune thyroiditis: Secondary | ICD-10-CM | POA: Insufficient documentation

## 2015-05-27 DIAGNOSIS — K909 Intestinal malabsorption, unspecified: Secondary | ICD-10-CM | POA: Diagnosis not present

## 2015-05-27 MED ORDER — LEVOTHYROXINE SODIUM 100 MCG IV SOLR
1000.0000 ug | INTRAVENOUS | Status: DC
  Administered 2015-05-27: 1000 ug via INTRAVENOUS
  Filled 2015-05-27: qty 50

## 2015-06-03 ENCOUNTER — Encounter (HOSPITAL_COMMUNITY)
Admission: RE | Admit: 2015-06-03 | Discharge: 2015-06-03 | Disposition: A | Discharge status: Home / Self Care | Payer: Medicare Other | Source: Ambulatory Visit | Attending: Endocrinology | Admitting: Endocrinology

## 2015-06-03 DIAGNOSIS — E039 Hypothyroidism, unspecified: Secondary | ICD-10-CM | POA: Diagnosis not present

## 2015-06-03 MED ORDER — LEVOTHYROXINE SODIUM 100 MCG IV SOLR
1000.0000 ug | INTRAVENOUS | Status: DC
  Administered 2015-06-03: 1000 ug via INTRAVENOUS
  Filled 2015-06-03: qty 50

## 2015-06-09 ENCOUNTER — Other Ambulatory Visit (HOSPITAL_COMMUNITY): Payer: Self-pay | Admitting: *Deleted

## 2015-06-10 ENCOUNTER — Encounter (HOSPITAL_COMMUNITY)
Admission: RE | Admit: 2015-06-10 | Discharge: 2015-06-10 | Disposition: A | Discharge status: Home / Self Care | Payer: Medicare Other | Source: Ambulatory Visit | Attending: Endocrinology | Admitting: Endocrinology

## 2015-06-10 DIAGNOSIS — E039 Hypothyroidism, unspecified: Secondary | ICD-10-CM | POA: Insufficient documentation

## 2015-06-10 DIAGNOSIS — K909 Intestinal malabsorption, unspecified: Secondary | ICD-10-CM | POA: Insufficient documentation

## 2015-06-10 DIAGNOSIS — E063 Autoimmune thyroiditis: Secondary | ICD-10-CM | POA: Insufficient documentation

## 2015-06-10 MED ORDER — LEVOTHYROXINE SODIUM 100 MCG IV SOLR
1000.0000 ug | INTRAVENOUS | Status: DC
  Administered 2015-06-10: 1000 ug via INTRAVENOUS
  Filled 2015-06-10: qty 50

## 2015-06-17 ENCOUNTER — Encounter (HOSPITAL_COMMUNITY): Payer: Medicare Other

## 2015-06-24 ENCOUNTER — Encounter (HOSPITAL_COMMUNITY)
Admission: RE | Admit: 2015-06-24 | Discharge: 2015-06-24 | Disposition: A | Discharge status: Home / Self Care | Payer: Medicare Other | Source: Ambulatory Visit | Attending: Endocrinology | Admitting: Endocrinology

## 2015-06-24 DIAGNOSIS — E039 Hypothyroidism, unspecified: Secondary | ICD-10-CM | POA: Diagnosis present

## 2015-06-24 DIAGNOSIS — E063 Autoimmune thyroiditis: Secondary | ICD-10-CM | POA: Diagnosis not present

## 2015-06-24 DIAGNOSIS — K909 Intestinal malabsorption, unspecified: Secondary | ICD-10-CM | POA: Diagnosis not present

## 2015-06-24 MED ORDER — LEVOTHYROXINE SODIUM 100 MCG IV SOLR
1000.0000 ug | INTRAVENOUS | Status: DC
  Administered 2015-06-24: 1000 ug via INTRAVENOUS
  Filled 2015-06-24: qty 50

## 2015-07-01 ENCOUNTER — Encounter (HOSPITAL_COMMUNITY): Payer: Medicare Other

## 2015-07-08 ENCOUNTER — Encounter (HOSPITAL_COMMUNITY)
Admission: RE | Admit: 2015-07-08 | Discharge: 2015-07-08 | Disposition: A | Discharge status: Home / Self Care | Payer: Medicare Other | Source: Ambulatory Visit | Attending: Endocrinology | Admitting: Endocrinology

## 2015-07-08 DIAGNOSIS — K909 Intestinal malabsorption, unspecified: Secondary | ICD-10-CM | POA: Insufficient documentation

## 2015-07-08 DIAGNOSIS — E039 Hypothyroidism, unspecified: Secondary | ICD-10-CM | POA: Diagnosis present

## 2015-07-08 DIAGNOSIS — E063 Autoimmune thyroiditis: Secondary | ICD-10-CM | POA: Diagnosis not present

## 2015-07-08 MED ORDER — LEVOTHYROXINE SODIUM 100 MCG IV SOLR
1000.0000 ug | INTRAVENOUS | Status: DC
  Administered 2015-07-08: 1000 ug via INTRAVENOUS
  Filled 2015-07-08: qty 50

## 2015-07-14 ENCOUNTER — Other Ambulatory Visit (HOSPITAL_COMMUNITY): Payer: Self-pay | Admitting: *Deleted

## 2015-07-15 ENCOUNTER — Encounter (HOSPITAL_COMMUNITY)
Admission: RE | Admit: 2015-07-15 | Discharge: 2015-07-15 | Disposition: A | Discharge status: Home / Self Care | Payer: Medicare Other | Source: Ambulatory Visit | Attending: Endocrinology | Admitting: Endocrinology

## 2015-07-15 DIAGNOSIS — E039 Hypothyroidism, unspecified: Secondary | ICD-10-CM | POA: Diagnosis not present

## 2015-07-15 MED ORDER — LEVOTHYROXINE SODIUM 100 MCG IV SOLR
1000.0000 ug | INTRAVENOUS | Status: DC
  Administered 2015-07-15: 1000 ug via INTRAVENOUS
  Filled 2015-07-15: qty 50

## 2015-07-22 ENCOUNTER — Encounter (HOSPITAL_COMMUNITY): Admission: RE | Admit: 2015-07-22 | Payer: Medicare Other | Source: Ambulatory Visit

## 2015-07-29 ENCOUNTER — Ambulatory Visit (HOSPITAL_COMMUNITY)
Admission: RE | Admit: 2015-07-29 | Discharge: 2015-07-29 | Disposition: A | Discharge status: Home / Self Care | Payer: Medicare Other | Source: Ambulatory Visit | Attending: Endocrinology | Admitting: Endocrinology

## 2015-07-29 DIAGNOSIS — E039 Hypothyroidism, unspecified: Secondary | ICD-10-CM | POA: Diagnosis present

## 2015-07-29 DIAGNOSIS — K909 Intestinal malabsorption, unspecified: Secondary | ICD-10-CM | POA: Insufficient documentation

## 2015-07-29 DIAGNOSIS — E063 Autoimmune thyroiditis: Secondary | ICD-10-CM | POA: Insufficient documentation

## 2015-07-29 MED ORDER — LEVOTHYROXINE SODIUM 100 MCG IV SOLR
1000.0000 ug | INTRAVENOUS | Status: DC
  Administered 2015-07-29: 1000 ug via INTRAVENOUS
  Filled 2015-07-29: qty 50

## 2015-08-04 ENCOUNTER — Other Ambulatory Visit (HOSPITAL_COMMUNITY): Payer: Self-pay | Admitting: *Deleted

## 2015-08-05 ENCOUNTER — Ambulatory Visit (HOSPITAL_COMMUNITY)
Admission: RE | Admit: 2015-08-05 | Discharge: 2015-08-05 | Disposition: A | Discharge status: Home / Self Care | Payer: Medicare Other | Source: Ambulatory Visit | Attending: Endocrinology | Admitting: Endocrinology

## 2015-08-05 DIAGNOSIS — E039 Hypothyroidism, unspecified: Secondary | ICD-10-CM | POA: Diagnosis not present

## 2015-08-05 MED ORDER — LEVOTHYROXINE SODIUM 100 MCG IV SOLR
1000.0000 ug | INTRAVENOUS | Status: DC
  Administered 2015-08-05: 1000 ug via INTRAVENOUS
  Filled 2015-08-05: qty 50

## 2015-08-12 ENCOUNTER — Encounter (HOSPITAL_COMMUNITY)
Admission: RE | Admit: 2015-08-12 | Discharge: 2015-08-12 | Disposition: A | Discharge status: Home / Self Care | Payer: Medicare Other | Source: Ambulatory Visit | Attending: Endocrinology | Admitting: Endocrinology

## 2015-08-12 DIAGNOSIS — E039 Hypothyroidism, unspecified: Secondary | ICD-10-CM | POA: Insufficient documentation

## 2015-08-12 DIAGNOSIS — K909 Intestinal malabsorption, unspecified: Secondary | ICD-10-CM | POA: Insufficient documentation

## 2015-08-12 DIAGNOSIS — E063 Autoimmune thyroiditis: Secondary | ICD-10-CM | POA: Diagnosis not present

## 2015-08-12 MED ORDER — LEVOTHYROXINE SODIUM 100 MCG IV SOLR
1000.0000 ug | INTRAVENOUS | Status: DC
  Administered 2015-08-12: 1000 ug via INTRAVENOUS
  Filled 2015-08-12: qty 50

## 2015-08-19 ENCOUNTER — Inpatient Hospital Stay (HOSPITAL_COMMUNITY): Admission: RE | Admit: 2015-08-19 | Payer: Medicare Other | Source: Ambulatory Visit

## 2015-08-25 ENCOUNTER — Other Ambulatory Visit (HOSPITAL_COMMUNITY): Payer: Self-pay | Admitting: *Deleted

## 2015-08-26 ENCOUNTER — Ambulatory Visit (HOSPITAL_COMMUNITY)
Admission: RE | Admit: 2015-08-26 | Discharge: 2015-08-26 | Disposition: A | Discharge status: Home / Self Care | Payer: Medicare Other | Source: Ambulatory Visit | Attending: Endocrinology | Admitting: Endocrinology

## 2015-08-26 DIAGNOSIS — E039 Hypothyroidism, unspecified: Secondary | ICD-10-CM | POA: Diagnosis not present

## 2015-08-26 MED ORDER — LEVOTHYROXINE SODIUM 100 MCG IV SOLR
1000.0000 ug | INTRAVENOUS | Status: DC
  Administered 2015-08-26: 1000 ug via INTRAVENOUS
  Filled 2015-08-26 (×2): qty 50

## 2015-09-03 ENCOUNTER — Ambulatory Visit (HOSPITAL_COMMUNITY)
Admission: RE | Admit: 2015-09-03 | Discharge: 2015-09-03 | Disposition: A | Discharge status: Home / Self Care | Payer: Medicare Other | Source: Ambulatory Visit | Attending: Endocrinology | Admitting: Endocrinology

## 2015-09-03 DIAGNOSIS — E039 Hypothyroidism, unspecified: Secondary | ICD-10-CM | POA: Diagnosis not present

## 2015-09-03 MED ORDER — LEVOTHYROXINE SODIUM 100 MCG IV SOLR
1000.0000 ug | INTRAVENOUS | Status: DC
  Administered 2015-09-03: 1000 ug via INTRAVENOUS
  Filled 2015-09-03: qty 50

## 2015-09-16 ENCOUNTER — Inpatient Hospital Stay (HOSPITAL_COMMUNITY): Admission: RE | Admit: 2015-09-16 | Payer: Medicare Other | Source: Ambulatory Visit

## 2015-09-17 ENCOUNTER — Encounter (HOSPITAL_COMMUNITY)
Admission: RE | Admit: 2015-09-17 | Discharge: 2015-09-17 | Disposition: A | Discharge status: Home / Self Care | Payer: Medicare Other | Source: Ambulatory Visit | Attending: Endocrinology | Admitting: Endocrinology

## 2015-09-17 ENCOUNTER — Encounter (HOSPITAL_COMMUNITY): Payer: Medicare Other

## 2015-09-17 DIAGNOSIS — E039 Hypothyroidism, unspecified: Secondary | ICD-10-CM | POA: Insufficient documentation

## 2015-09-17 DIAGNOSIS — E063 Autoimmune thyroiditis: Secondary | ICD-10-CM | POA: Diagnosis not present

## 2015-09-17 DIAGNOSIS — K909 Intestinal malabsorption, unspecified: Secondary | ICD-10-CM | POA: Insufficient documentation

## 2015-09-17 MED ORDER — LEVOTHYROXINE SODIUM 100 MCG IV SOLR
1000.0000 ug | INTRAVENOUS | Status: DC
  Filled 2015-09-17: qty 50

## 2015-09-17 MED ORDER — LEVOTHYROXINE SODIUM 200 MCG IV SOLR
1000.0000 ug | INTRAVENOUS | Status: DC
  Administered 2015-09-17: 1000 ug via INTRAVENOUS
  Filled 2015-09-17: qty 25

## 2015-09-23 ENCOUNTER — Ambulatory Visit (HOSPITAL_COMMUNITY): Payer: Medicare Other

## 2015-09-30 ENCOUNTER — Ambulatory Visit (HOSPITAL_COMMUNITY)
Admission: RE | Admit: 2015-09-30 | Discharge: 2015-09-30 | Disposition: A | Discharge status: Home / Self Care | Payer: Medicare Other | Source: Ambulatory Visit | Attending: Endocrinology | Admitting: Endocrinology

## 2015-09-30 DIAGNOSIS — K909 Intestinal malabsorption, unspecified: Secondary | ICD-10-CM | POA: Insufficient documentation

## 2015-09-30 DIAGNOSIS — E063 Autoimmune thyroiditis: Secondary | ICD-10-CM | POA: Insufficient documentation

## 2015-09-30 DIAGNOSIS — E039 Hypothyroidism, unspecified: Secondary | ICD-10-CM | POA: Insufficient documentation

## 2015-09-30 MED ORDER — LEVOTHYROXINE SODIUM 100 MCG IV SOLR
1000.0000 ug | INTRAVENOUS | Status: DC
  Administered 2015-09-30: 1000 ug via INTRAVENOUS
  Filled 2015-09-30: qty 50

## 2015-10-07 ENCOUNTER — Ambulatory Visit (HOSPITAL_COMMUNITY)
Admission: RE | Admit: 2015-10-07 | Discharge: 2015-10-07 | Disposition: A | Discharge status: Home / Self Care | Payer: Medicare Other | Source: Ambulatory Visit | Attending: Endocrinology | Admitting: Endocrinology

## 2015-10-07 DIAGNOSIS — E063 Autoimmune thyroiditis: Secondary | ICD-10-CM | POA: Diagnosis not present

## 2015-10-07 DIAGNOSIS — K909 Intestinal malabsorption, unspecified: Secondary | ICD-10-CM | POA: Diagnosis not present

## 2015-10-07 DIAGNOSIS — E039 Hypothyroidism, unspecified: Secondary | ICD-10-CM | POA: Diagnosis present

## 2015-10-07 MED ORDER — LEVOTHYROXINE SODIUM 100 MCG IV SOLR
1000.0000 ug | INTRAVENOUS | Status: DC
  Administered 2015-10-07: 1000 ug via INTRAVENOUS
  Filled 2015-10-07: qty 50

## 2015-10-08 ENCOUNTER — Encounter (HOSPITAL_COMMUNITY): Payer: Medicare Other

## 2015-10-13 ENCOUNTER — Other Ambulatory Visit (HOSPITAL_COMMUNITY): Payer: Self-pay | Admitting: *Deleted

## 2015-10-14 ENCOUNTER — Ambulatory Visit (HOSPITAL_COMMUNITY): Payer: Medicare Other

## 2015-10-15 ENCOUNTER — Encounter (HOSPITAL_COMMUNITY): Payer: Medicare Other

## 2015-10-20 ENCOUNTER — Other Ambulatory Visit (HOSPITAL_COMMUNITY): Payer: Self-pay | Admitting: *Deleted

## 2015-10-21 ENCOUNTER — Ambulatory Visit (HOSPITAL_COMMUNITY)
Admission: RE | Admit: 2015-10-21 | Discharge: 2015-10-21 | Disposition: A | Discharge status: Home / Self Care | Payer: Medicare Other | Source: Ambulatory Visit | Attending: Endocrinology | Admitting: Endocrinology

## 2015-10-21 DIAGNOSIS — E063 Autoimmune thyroiditis: Secondary | ICD-10-CM | POA: Diagnosis not present

## 2015-10-21 DIAGNOSIS — E039 Hypothyroidism, unspecified: Secondary | ICD-10-CM | POA: Diagnosis present

## 2015-10-21 DIAGNOSIS — K909 Intestinal malabsorption, unspecified: Secondary | ICD-10-CM | POA: Insufficient documentation

## 2015-10-21 MED ORDER — LEVOTHYROXINE SODIUM 100 MCG IV SOLR
1000.0000 ug | INTRAVENOUS | Status: DC
  Administered 2015-10-21: 1000 ug via INTRAVENOUS
  Filled 2015-10-21: qty 50

## 2015-10-28 ENCOUNTER — Ambulatory Visit (HOSPITAL_COMMUNITY)
Admission: RE | Admit: 2015-10-28 | Discharge: 2015-10-28 | Disposition: A | Discharge status: Home / Self Care | Payer: Medicare Other | Source: Ambulatory Visit | Attending: Endocrinology | Admitting: Endocrinology

## 2015-10-28 DIAGNOSIS — E063 Autoimmune thyroiditis: Secondary | ICD-10-CM | POA: Diagnosis not present

## 2015-10-28 DIAGNOSIS — E039 Hypothyroidism, unspecified: Secondary | ICD-10-CM | POA: Insufficient documentation

## 2015-10-28 DIAGNOSIS — K909 Intestinal malabsorption, unspecified: Secondary | ICD-10-CM | POA: Insufficient documentation

## 2015-10-28 MED ORDER — LEVOTHYROXINE SODIUM 100 MCG IV SOLR
1000.0000 ug | INTRAVENOUS | Status: DC
  Filled 2015-10-28: qty 50

## 2015-10-28 MED ORDER — LEVOTHYROXINE SODIUM 100 MCG IV SOLR
1000.0000 ug | INTRAVENOUS | Status: DC
  Administered 2015-10-28: 1000 ug via INTRAVENOUS

## 2015-11-04 ENCOUNTER — Encounter (HOSPITAL_COMMUNITY)
Admission: RE | Admit: 2015-11-04 | Discharge: 2015-11-04 | Disposition: A | Discharge status: Home / Self Care | Payer: Medicare Other | Source: Ambulatory Visit | Attending: Endocrinology | Admitting: Endocrinology

## 2015-11-04 DIAGNOSIS — E039 Hypothyroidism, unspecified: Secondary | ICD-10-CM | POA: Diagnosis present

## 2015-11-04 DIAGNOSIS — E063 Autoimmune thyroiditis: Secondary | ICD-10-CM | POA: Insufficient documentation

## 2015-11-04 DIAGNOSIS — K909 Intestinal malabsorption, unspecified: Secondary | ICD-10-CM | POA: Diagnosis not present

## 2015-11-04 MED ORDER — LEVOTHYROXINE SODIUM 100 MCG IV SOLR
1000.0000 ug | INTRAVENOUS | Status: DC
  Administered 2015-11-04: 1000 ug via INTRAVENOUS
  Filled 2015-11-04: qty 50

## 2015-11-11 ENCOUNTER — Ambulatory Visit (HOSPITAL_COMMUNITY): Payer: Medicare Other

## 2015-11-18 ENCOUNTER — Ambulatory Visit (HOSPITAL_COMMUNITY)
Admission: RE | Admit: 2015-11-18 | Discharge: 2015-11-18 | Disposition: A | Discharge status: Home / Self Care | Payer: Medicare Other | Source: Ambulatory Visit | Attending: Endocrinology | Admitting: Endocrinology

## 2015-11-18 DIAGNOSIS — K909 Intestinal malabsorption, unspecified: Secondary | ICD-10-CM | POA: Diagnosis not present

## 2015-11-18 DIAGNOSIS — E039 Hypothyroidism, unspecified: Secondary | ICD-10-CM | POA: Insufficient documentation

## 2015-11-18 DIAGNOSIS — E063 Autoimmune thyroiditis: Secondary | ICD-10-CM | POA: Insufficient documentation

## 2015-11-18 MED ORDER — LEVOTHYROXINE SODIUM 100 MCG IV SOLR
1000.0000 ug | INTRAVENOUS | Status: DC
  Administered 2015-11-18: 1000 ug via INTRAVENOUS
  Filled 2015-11-18: qty 50

## 2015-11-25 ENCOUNTER — Ambulatory Visit (HOSPITAL_COMMUNITY)
Admission: RE | Admit: 2015-11-25 | Discharge: 2015-11-25 | Disposition: A | Discharge status: Home / Self Care | Payer: Medicare Other | Source: Ambulatory Visit | Attending: Endocrinology | Admitting: Endocrinology

## 2015-11-25 DIAGNOSIS — K909 Intestinal malabsorption, unspecified: Secondary | ICD-10-CM | POA: Insufficient documentation

## 2015-11-25 DIAGNOSIS — E063 Autoimmune thyroiditis: Secondary | ICD-10-CM | POA: Insufficient documentation

## 2015-11-25 DIAGNOSIS — E039 Hypothyroidism, unspecified: Secondary | ICD-10-CM | POA: Diagnosis present

## 2015-11-25 MED ORDER — LEVOTHYROXINE SODIUM 100 MCG IV SOLR
1000.0000 ug | INTRAVENOUS | Status: DC
  Administered 2015-11-25: 1000 ug via INTRAVENOUS
  Filled 2015-11-25: qty 50

## 2015-12-01 ENCOUNTER — Other Ambulatory Visit (HOSPITAL_COMMUNITY): Payer: Self-pay | Admitting: *Deleted

## 2015-12-02 ENCOUNTER — Ambulatory Visit (HOSPITAL_COMMUNITY): Payer: Medicare Other

## 2015-12-08 ENCOUNTER — Other Ambulatory Visit (HOSPITAL_COMMUNITY): Payer: Self-pay | Admitting: *Deleted

## 2015-12-09 ENCOUNTER — Encounter (HOSPITAL_COMMUNITY)
Admission: RE | Admit: 2015-12-09 | Discharge: 2015-12-09 | Disposition: A | Discharge status: Home / Self Care | Payer: Medicare Other | Source: Ambulatory Visit | Attending: Endocrinology | Admitting: Endocrinology

## 2015-12-09 DIAGNOSIS — E063 Autoimmune thyroiditis: Secondary | ICD-10-CM | POA: Insufficient documentation

## 2015-12-09 DIAGNOSIS — E039 Hypothyroidism, unspecified: Secondary | ICD-10-CM | POA: Diagnosis not present

## 2015-12-09 DIAGNOSIS — K909 Intestinal malabsorption, unspecified: Secondary | ICD-10-CM | POA: Insufficient documentation

## 2015-12-09 MED ORDER — LEVOTHYROXINE SODIUM 100 MCG IV SOLR
1000.0000 ug | INTRAVENOUS | Status: DC
  Administered 2015-12-09: 1000 ug via INTRAVENOUS
  Filled 2015-12-09: qty 50

## 2015-12-16 ENCOUNTER — Encounter (HOSPITAL_COMMUNITY)
Admission: RE | Admit: 2015-12-16 | Discharge: 2015-12-16 | Disposition: A | Discharge status: Home / Self Care | Payer: Medicare Other | Source: Ambulatory Visit | Attending: Endocrinology | Admitting: Endocrinology

## 2015-12-16 DIAGNOSIS — E039 Hypothyroidism, unspecified: Secondary | ICD-10-CM | POA: Diagnosis not present

## 2015-12-16 MED ORDER — LEVOTHYROXINE SODIUM 100 MCG IV SOLR
1000.0000 ug | INTRAVENOUS | Status: DC
  Administered 2015-12-16: 1000 ug via INTRAVENOUS
  Filled 2015-12-16: qty 50

## 2015-12-23 ENCOUNTER — Encounter (HOSPITAL_COMMUNITY)
Admission: RE | Admit: 2015-12-23 | Discharge: 2015-12-23 | Disposition: A | Discharge status: Home / Self Care | Payer: Medicare Other | Source: Ambulatory Visit | Attending: Endocrinology | Admitting: Endocrinology

## 2015-12-23 DIAGNOSIS — E039 Hypothyroidism, unspecified: Secondary | ICD-10-CM | POA: Diagnosis not present

## 2015-12-23 MED ORDER — LEVOTHYROXINE SODIUM 100 MCG IV SOLR
1000.0000 ug | INTRAVENOUS | Status: DC
  Administered 2015-12-23: 1000 ug via INTRAVENOUS
  Filled 2015-12-23: qty 50

## 2015-12-30 ENCOUNTER — Inpatient Hospital Stay (HOSPITAL_COMMUNITY): Admission: RE | Admit: 2015-12-30 | Payer: Medicare Other | Source: Ambulatory Visit

## 2016-01-06 ENCOUNTER — Encounter (HOSPITAL_COMMUNITY): Payer: Medicare Other

## 2016-01-13 ENCOUNTER — Encounter (HOSPITAL_COMMUNITY)
Admission: RE | Admit: 2016-01-13 | Discharge: 2016-01-13 | Disposition: A | Discharge status: Home / Self Care | Payer: Medicare Other | Source: Ambulatory Visit | Attending: Endocrinology | Admitting: Endocrinology

## 2016-01-13 DIAGNOSIS — E039 Hypothyroidism, unspecified: Secondary | ICD-10-CM | POA: Diagnosis present

## 2016-01-13 DIAGNOSIS — K909 Intestinal malabsorption, unspecified: Secondary | ICD-10-CM | POA: Insufficient documentation

## 2016-01-13 DIAGNOSIS — E063 Autoimmune thyroiditis: Secondary | ICD-10-CM | POA: Insufficient documentation

## 2016-01-13 MED ORDER — LEVOTHYROXINE SODIUM 100 MCG IV SOLR
1000.0000 ug | INTRAVENOUS | Status: DC
  Administered 2016-01-13: 1000 ug via INTRAVENOUS
  Filled 2016-01-13 (×2): qty 50

## 2016-01-20 ENCOUNTER — Encounter (HOSPITAL_COMMUNITY)
Admission: RE | Admit: 2016-01-20 | Discharge: 2016-01-20 | Disposition: A | Discharge status: Home / Self Care | Payer: Medicare Other | Source: Ambulatory Visit | Attending: Endocrinology | Admitting: Endocrinology

## 2016-01-20 DIAGNOSIS — E039 Hypothyroidism, unspecified: Secondary | ICD-10-CM | POA: Diagnosis not present

## 2016-01-20 MED ORDER — LEVOTHYROXINE SODIUM 100 MCG IV SOLR
1000.0000 ug | INTRAVENOUS | Status: DC
  Administered 2016-01-20: 1000 ug via INTRAVENOUS
  Filled 2016-01-20 (×2): qty 50

## 2016-01-27 ENCOUNTER — Encounter (HOSPITAL_COMMUNITY): Payer: Medicare Other

## 2016-02-03 ENCOUNTER — Encounter (HOSPITAL_COMMUNITY)
Admission: RE | Admit: 2016-02-03 | Discharge: 2016-02-03 | Disposition: A | Discharge status: Home / Self Care | Payer: Medicare Other | Source: Ambulatory Visit | Attending: Endocrinology | Admitting: Endocrinology

## 2016-02-03 DIAGNOSIS — E039 Hypothyroidism, unspecified: Secondary | ICD-10-CM | POA: Diagnosis not present

## 2016-02-03 MED ORDER — LEVOTHYROXINE SODIUM 100 MCG IV SOLR
1000.0000 ug | INTRAVENOUS | Status: DC
  Administered 2016-02-03: 1000 ug via INTRAVENOUS
  Filled 2016-02-03: qty 50

## 2016-02-10 ENCOUNTER — Encounter (HOSPITAL_COMMUNITY)
Admission: RE | Admit: 2016-02-10 | Discharge: 2016-02-10 | Disposition: A | Discharge status: Home / Self Care | Payer: Medicare Other | Source: Ambulatory Visit | Attending: Endocrinology | Admitting: Endocrinology

## 2016-02-10 ENCOUNTER — Encounter (HOSPITAL_COMMUNITY): Payer: Medicare Other

## 2016-02-10 DIAGNOSIS — E063 Autoimmune thyroiditis: Secondary | ICD-10-CM | POA: Diagnosis not present

## 2016-02-10 DIAGNOSIS — E039 Hypothyroidism, unspecified: Secondary | ICD-10-CM | POA: Diagnosis present

## 2016-02-10 DIAGNOSIS — K909 Intestinal malabsorption, unspecified: Secondary | ICD-10-CM | POA: Insufficient documentation

## 2016-02-10 MED ORDER — LEVOTHYROXINE SODIUM 100 MCG IV SOLR
1000.0000 ug | INTRAVENOUS | Status: DC
  Administered 2016-02-10: 1000 ug via INTRAVENOUS
  Filled 2016-02-10: qty 50

## 2016-02-17 ENCOUNTER — Encounter (HOSPITAL_COMMUNITY)
Admission: RE | Admit: 2016-02-17 | Discharge: 2016-02-17 | Disposition: A | Discharge status: Home / Self Care | Payer: Medicare Other | Source: Ambulatory Visit | Attending: Endocrinology | Admitting: Endocrinology

## 2016-02-17 DIAGNOSIS — E039 Hypothyroidism, unspecified: Secondary | ICD-10-CM | POA: Diagnosis not present

## 2016-02-17 MED ORDER — LEVOTHYROXINE SODIUM 100 MCG IV SOLR
1000.0000 ug | INTRAVENOUS | Status: DC
  Administered 2016-02-17: 1000 ug via INTRAVENOUS
  Filled 2016-02-17: qty 50

## 2016-02-24 ENCOUNTER — Encounter (HOSPITAL_COMMUNITY)
Admission: RE | Admit: 2016-02-24 | Discharge: 2016-02-24 | Disposition: A | Discharge status: Home / Self Care | Payer: Medicare Other | Source: Ambulatory Visit | Attending: Endocrinology | Admitting: Endocrinology

## 2016-02-24 DIAGNOSIS — E039 Hypothyroidism, unspecified: Secondary | ICD-10-CM | POA: Diagnosis not present

## 2016-02-24 MED ORDER — LEVOTHYROXINE SODIUM 100 MCG IV SOLR
1000.0000 ug | INTRAVENOUS | Status: DC
  Administered 2016-02-24: 1000 ug via INTRAVENOUS
  Filled 2016-02-24 (×2): qty 50

## 2016-03-02 ENCOUNTER — Encounter (HOSPITAL_COMMUNITY): Payer: Medicare Other

## 2016-03-10 ENCOUNTER — Encounter (HOSPITAL_COMMUNITY)
Admission: RE | Admit: 2016-03-10 | Discharge: 2016-03-10 | Disposition: A | Discharge status: Home / Self Care | Payer: Medicare Other | Source: Ambulatory Visit | Attending: Endocrinology | Admitting: Endocrinology

## 2016-03-10 DIAGNOSIS — E039 Hypothyroidism, unspecified: Secondary | ICD-10-CM | POA: Insufficient documentation

## 2016-03-10 DIAGNOSIS — K909 Intestinal malabsorption, unspecified: Secondary | ICD-10-CM | POA: Insufficient documentation

## 2016-03-10 DIAGNOSIS — E063 Autoimmune thyroiditis: Secondary | ICD-10-CM | POA: Diagnosis not present

## 2016-03-10 MED ORDER — LEVOTHYROXINE SODIUM 100 MCG IV SOLR
1000.0000 ug | INTRAVENOUS | Status: DC
Start: 2016-03-10 — End: 2016-03-10

## 2016-03-10 MED ORDER — LEVOTHYROXINE SODIUM 500 MCG IV SOLR
1000.0000 ug | Freq: Once | INTRAVENOUS | Status: AC
  Administered 2016-03-10: 1000 ug via INTRAVENOUS
  Filled 2016-03-10: qty 10

## 2016-03-16 ENCOUNTER — Encounter (HOSPITAL_COMMUNITY)
Admission: RE | Admit: 2016-03-16 | Discharge: 2016-03-16 | Disposition: A | Discharge status: Home / Self Care | Payer: Medicare Other | Source: Ambulatory Visit | Attending: Endocrinology | Admitting: Endocrinology

## 2016-03-16 DIAGNOSIS — E039 Hypothyroidism, unspecified: Secondary | ICD-10-CM | POA: Diagnosis not present

## 2016-03-16 MED ORDER — LEVOTHYROXINE SODIUM 200 MCG IV SOLR
1000.0000 ug | Freq: Once | INTRAVENOUS | Status: DC
  Administered 2016-03-16: 1000 ug via INTRAVENOUS
  Filled 2016-03-16 (×2): qty 25

## 2016-03-23 ENCOUNTER — Encounter (HOSPITAL_COMMUNITY): Payer: Medicare Other

## 2016-03-30 ENCOUNTER — Encounter (HOSPITAL_COMMUNITY)
Admission: RE | Admit: 2016-03-30 | Discharge: 2016-03-30 | Disposition: A | Discharge status: Home / Self Care | Payer: Medicare Other | Source: Ambulatory Visit | Attending: Endocrinology | Admitting: Endocrinology

## 2016-03-30 DIAGNOSIS — E039 Hypothyroidism, unspecified: Secondary | ICD-10-CM | POA: Diagnosis not present

## 2016-03-30 MED ORDER — LEVOTHYROXINE SODIUM 200 MCG IV SOLR
1000.0000 ug | Freq: Once | INTRAVENOUS | Status: DC
  Administered 2016-03-30: 1000 ug via INTRAVENOUS
  Filled 2016-03-30: qty 25

## 2016-04-05 ENCOUNTER — Other Ambulatory Visit (HOSPITAL_COMMUNITY): Payer: Self-pay | Admitting: *Deleted

## 2016-04-06 ENCOUNTER — Encounter (HOSPITAL_COMMUNITY): Payer: Medicare Other

## 2016-04-13 ENCOUNTER — Encounter (HOSPITAL_COMMUNITY)
Admission: RE | Admit: 2016-04-13 | Discharge: 2016-04-13 | Disposition: A | Discharge status: Home / Self Care | Payer: Medicare Other | Source: Ambulatory Visit | Attending: Endocrinology | Admitting: Endocrinology

## 2016-04-13 DIAGNOSIS — E063 Autoimmune thyroiditis: Secondary | ICD-10-CM | POA: Diagnosis not present

## 2016-04-13 DIAGNOSIS — K909 Intestinal malabsorption, unspecified: Secondary | ICD-10-CM | POA: Diagnosis not present

## 2016-04-13 DIAGNOSIS — E039 Hypothyroidism, unspecified: Secondary | ICD-10-CM | POA: Diagnosis present

## 2016-04-13 MED ORDER — SODIUM CHLORIDE 0.9 % IV SOLN: INTRAVENOUS | Status: DC

## 2016-04-13 MED ORDER — LEVOTHYROXINE SODIUM 100 MCG IV SOLR
1000.0000 ug | Freq: Every day | INTRAVENOUS | Status: DC
  Administered 2016-04-13: 1000 ug via INTRAVENOUS
  Filled 2016-04-13: qty 50

## 2016-04-20 ENCOUNTER — Encounter (HOSPITAL_COMMUNITY)
Admission: RE | Admit: 2016-04-20 | Discharge: 2016-04-20 | Disposition: A | Discharge status: Home / Self Care | Payer: Medicare Other | Source: Ambulatory Visit | Attending: Endocrinology | Admitting: Endocrinology

## 2016-04-20 DIAGNOSIS — E039 Hypothyroidism, unspecified: Secondary | ICD-10-CM | POA: Diagnosis not present

## 2016-04-20 MED ORDER — LEVOTHYROXINE SODIUM 200 MCG IV SOLR
1000.0000 ug | Freq: Every day | INTRAVENOUS | Status: DC
  Administered 2016-04-20: 1000 ug via INTRAVENOUS
  Filled 2016-04-20: qty 25

## 2016-04-20 MED ORDER — SODIUM CHLORIDE 0.9 % IV SOLN
INTRAVENOUS | Status: DC
  Administered 2016-04-20: 10:00:00 via INTRAVENOUS

## 2016-04-27 ENCOUNTER — Encounter (HOSPITAL_COMMUNITY)
Admission: RE | Admit: 2016-04-27 | Discharge: 2016-04-27 | Disposition: A | Discharge status: Home / Self Care | Payer: Medicare Other | Source: Ambulatory Visit | Attending: Endocrinology | Admitting: Endocrinology

## 2016-04-27 DIAGNOSIS — E039 Hypothyroidism, unspecified: Secondary | ICD-10-CM | POA: Diagnosis not present

## 2016-04-27 MED ORDER — LEVOTHYROXINE SODIUM 200 MCG IV SOLR
1000.0000 ug | Freq: Every day | INTRAVENOUS | Status: DC
  Administered 2016-04-27: 1000 ug via INTRAVENOUS
  Filled 2016-04-27: qty 25

## 2016-04-27 MED ORDER — SODIUM CHLORIDE 0.9 % IV SOLN
INTRAVENOUS | Status: DC
Start: 2016-04-27 — End: 2016-04-28
  Administered 2016-04-27: 11:00:00 via INTRAVENOUS

## 2016-05-04 ENCOUNTER — Encounter (HOSPITAL_COMMUNITY): Payer: Medicare Other

## 2016-05-07 ENCOUNTER — Other Ambulatory Visit (HOSPITAL_COMMUNITY): Payer: Self-pay | Admitting: *Deleted

## 2016-05-11 ENCOUNTER — Encounter (HOSPITAL_COMMUNITY)
Admission: RE | Admit: 2016-05-11 | Discharge: 2016-05-11 | Disposition: A | Discharge status: Home / Self Care | Payer: Medicare Other | Source: Ambulatory Visit | Attending: Endocrinology | Admitting: Endocrinology

## 2016-05-11 DIAGNOSIS — E039 Hypothyroidism, unspecified: Secondary | ICD-10-CM | POA: Insufficient documentation

## 2016-05-11 DIAGNOSIS — K909 Intestinal malabsorption, unspecified: Secondary | ICD-10-CM | POA: Insufficient documentation

## 2016-05-11 DIAGNOSIS — E063 Autoimmune thyroiditis: Secondary | ICD-10-CM | POA: Diagnosis not present

## 2016-05-11 MED ORDER — LEVOTHYROXINE SODIUM 100 MCG IV SOLR: 1000.0000 ug | Freq: Once | INTRAVENOUS | Status: DC

## 2016-05-11 MED ORDER — LEVOTHYROXINE SODIUM 200 MCG IV SOLR
1000.0000 ug | Freq: Once | INTRAVENOUS | Status: AC
  Administered 2016-05-11: 1000 ug via INTRAVENOUS
  Filled 2016-05-11: qty 25

## 2016-05-18 ENCOUNTER — Inpatient Hospital Stay (HOSPITAL_COMMUNITY): Admission: RE | Admit: 2016-05-18 | Payer: Medicare Other | Source: Ambulatory Visit

## 2016-05-25 ENCOUNTER — Encounter (HOSPITAL_COMMUNITY)
Admission: RE | Admit: 2016-05-25 | Discharge: 2016-05-25 | Disposition: A | Discharge status: Home / Self Care | Payer: Medicare Other | Source: Ambulatory Visit | Attending: Endocrinology | Admitting: Endocrinology

## 2016-05-25 DIAGNOSIS — E039 Hypothyroidism, unspecified: Secondary | ICD-10-CM | POA: Diagnosis not present

## 2016-05-25 MED ORDER — LEVOTHYROXINE SODIUM 100 MCG IV SOLR
1000.0000 ug | Freq: Once | INTRAVENOUS | Status: AC
  Administered 2016-05-25: 1000 ug via INTRAVENOUS
  Filled 2016-05-25: qty 50

## 2016-06-01 ENCOUNTER — Inpatient Hospital Stay (HOSPITAL_COMMUNITY): Admission: RE | Admit: 2016-06-01 | Payer: Medicare Other | Source: Ambulatory Visit

## 2016-06-08 ENCOUNTER — Encounter (HOSPITAL_COMMUNITY)
Admission: RE | Admit: 2016-06-08 | Discharge: 2016-06-08 | Disposition: A | Discharge status: Home / Self Care | Payer: Medicare Other | Source: Ambulatory Visit | Attending: Endocrinology | Admitting: Endocrinology

## 2016-06-08 DIAGNOSIS — E063 Autoimmune thyroiditis: Secondary | ICD-10-CM | POA: Diagnosis not present

## 2016-06-08 DIAGNOSIS — E039 Hypothyroidism, unspecified: Secondary | ICD-10-CM | POA: Diagnosis not present

## 2016-06-08 DIAGNOSIS — K909 Intestinal malabsorption, unspecified: Secondary | ICD-10-CM | POA: Insufficient documentation

## 2016-06-08 MED ORDER — LEVOTHYROXINE SODIUM 100 MCG IV SOLR
1000.0000 ug | Freq: Once | INTRAVENOUS | Status: AC
  Administered 2016-06-08: 1000 ug via INTRAVENOUS
  Filled 2016-06-08: qty 50

## 2016-06-15 ENCOUNTER — Encounter (HOSPITAL_COMMUNITY)
Admission: RE | Admit: 2016-06-15 | Discharge: 2016-06-15 | Disposition: A | Discharge status: Home / Self Care | Payer: Medicare Other | Source: Ambulatory Visit | Attending: Endocrinology | Admitting: Endocrinology

## 2016-06-15 DIAGNOSIS — E039 Hypothyroidism, unspecified: Secondary | ICD-10-CM | POA: Diagnosis not present

## 2016-06-15 MED ORDER — LEVOTHYROXINE SODIUM 100 MCG IV SOLR
100.0000 ug | INTRAVENOUS | Status: DC
  Administered 2016-06-15: 100 ug via INTRAVENOUS
  Filled 2016-06-15: qty 5

## 2016-06-15 MED ORDER — LEVOTHYROXINE SODIUM 200 MCG IV SOLR
1000.0000 ug | Freq: Once | INTRAVENOUS | Status: DC
  Administered 2016-06-15: 1000 ug via INTRAVENOUS
  Filled 2016-06-15: qty 25

## 2016-06-21 ENCOUNTER — Other Ambulatory Visit (HOSPITAL_COMMUNITY): Payer: Self-pay | Admitting: *Deleted

## 2016-06-22 ENCOUNTER — Encounter (HOSPITAL_COMMUNITY): Payer: Self-pay

## 2016-06-22 ENCOUNTER — Encounter (HOSPITAL_COMMUNITY)
Admission: RE | Admit: 2016-06-22 | Discharge: 2016-06-22 | Disposition: A | Discharge status: Home / Self Care | Payer: Medicare Other | Source: Ambulatory Visit | Attending: Endocrinology | Admitting: Endocrinology

## 2016-06-22 DIAGNOSIS — E039 Hypothyroidism, unspecified: Secondary | ICD-10-CM | POA: Diagnosis not present

## 2016-06-22 MED ORDER — LEVOTHYROXINE SODIUM 100 MCG IV SOLR
1100.0000 ug | INTRAVENOUS | Status: DC
  Administered 2016-06-22: 1100 ug via INTRAVENOUS
  Filled 2016-06-22: qty 55

## 2016-06-29 ENCOUNTER — Encounter (HOSPITAL_COMMUNITY): Payer: Medicare Other

## 2016-07-06 ENCOUNTER — Encounter (HOSPITAL_COMMUNITY)
Admission: RE | Admit: 2016-07-06 | Discharge: 2016-07-06 | Disposition: A | Discharge status: Home / Self Care | Payer: Medicare Other | Source: Ambulatory Visit | Attending: Endocrinology | Admitting: Endocrinology

## 2016-07-06 DIAGNOSIS — E039 Hypothyroidism, unspecified: Secondary | ICD-10-CM | POA: Diagnosis not present

## 2016-07-06 MED ORDER — LEVOTHYROXINE SODIUM 100 MCG IV SOLR
1100.0000 ug | INTRAVENOUS | Status: DC
  Administered 2016-07-06: 1100 ug via INTRAVENOUS
  Filled 2016-07-06: qty 55

## 2016-07-13 ENCOUNTER — Encounter (HOSPITAL_COMMUNITY)
Admission: RE | Admit: 2016-07-13 | Discharge: 2016-07-13 | Disposition: A | Discharge status: Home / Self Care | Payer: Medicare Other | Source: Ambulatory Visit | Attending: Endocrinology | Admitting: Endocrinology

## 2016-07-13 DIAGNOSIS — K909 Intestinal malabsorption, unspecified: Secondary | ICD-10-CM | POA: Diagnosis not present

## 2016-07-13 DIAGNOSIS — E063 Autoimmune thyroiditis: Secondary | ICD-10-CM | POA: Insufficient documentation

## 2016-07-13 DIAGNOSIS — E039 Hypothyroidism, unspecified: Secondary | ICD-10-CM | POA: Diagnosis present

## 2016-07-13 MED ORDER — LEVOTHYROXINE SODIUM 100 MCG IV SOLR
1100.0000 ug | INTRAVENOUS | Status: DC
  Administered 2016-07-13: 1100 ug via INTRAVENOUS
  Filled 2016-07-13: qty 55

## 2016-07-20 ENCOUNTER — Inpatient Hospital Stay (HOSPITAL_COMMUNITY): Admission: RE | Admit: 2016-07-20 | Payer: Medicare Other | Source: Ambulatory Visit

## 2016-07-27 ENCOUNTER — Encounter (HOSPITAL_COMMUNITY)
Admission: RE | Admit: 2016-07-27 | Discharge: 2016-07-27 | Disposition: A | Discharge status: Home / Self Care | Payer: Medicare Other | Source: Ambulatory Visit | Attending: Endocrinology | Admitting: Endocrinology

## 2016-07-27 DIAGNOSIS — E039 Hypothyroidism, unspecified: Secondary | ICD-10-CM | POA: Diagnosis not present

## 2016-07-27 MED ORDER — LEVOTHYROXINE SODIUM 100 MCG IV SOLR
1100.0000 ug | INTRAVENOUS | Status: DC
  Administered 2016-07-27: 1100 ug via INTRAVENOUS
  Filled 2016-07-27: qty 55

## 2016-08-03 ENCOUNTER — Emergency Department (HOSPITAL_COMMUNITY): Payer: Medicare Other

## 2016-08-03 ENCOUNTER — Encounter (HOSPITAL_COMMUNITY): Payer: Self-pay

## 2016-08-03 ENCOUNTER — Inpatient Hospital Stay (HOSPITAL_COMMUNITY): Payer: Medicare Other

## 2016-08-03 ENCOUNTER — Inpatient Hospital Stay (HOSPITAL_COMMUNITY)
Admission: EM | Admit: 2016-08-03 | Discharge: 2016-08-10 | DRG: 871 | Disposition: A | Discharge status: Skilled Nursing Facility | Payer: Medicare Other | Attending: Internal Medicine | Admitting: Internal Medicine

## 2016-08-03 ENCOUNTER — Encounter (HOSPITAL_COMMUNITY): Payer: Medicare Other

## 2016-08-03 DIAGNOSIS — J13 Pneumonia due to Streptococcus pneumoniae: Secondary | ICD-10-CM | POA: Diagnosis present

## 2016-08-03 DIAGNOSIS — I2510 Atherosclerotic heart disease of native coronary artery without angina pectoris: Secondary | ICD-10-CM | POA: Diagnosis present

## 2016-08-03 DIAGNOSIS — E039 Hypothyroidism, unspecified: Secondary | ICD-10-CM | POA: Diagnosis not present

## 2016-08-03 DIAGNOSIS — K580 Irritable bowel syndrome with diarrhea: Secondary | ICD-10-CM | POA: Diagnosis not present

## 2016-08-03 DIAGNOSIS — E870 Hyperosmolality and hypernatremia: Secondary | ICD-10-CM | POA: Diagnosis present

## 2016-08-03 DIAGNOSIS — J9690 Respiratory failure, unspecified, unspecified whether with hypoxia or hypercapnia: Secondary | ICD-10-CM | POA: Diagnosis present

## 2016-08-03 DIAGNOSIS — Z87891 Personal history of nicotine dependence: Secondary | ICD-10-CM

## 2016-08-03 DIAGNOSIS — J9610 Chronic respiratory failure, unspecified whether with hypoxia or hypercapnia: Secondary | ICD-10-CM | POA: Diagnosis not present

## 2016-08-03 DIAGNOSIS — A419 Sepsis, unspecified organism: Principal | ICD-10-CM | POA: Diagnosis present

## 2016-08-03 DIAGNOSIS — Z7902 Long term (current) use of antithrombotics/antiplatelets: Secondary | ICD-10-CM

## 2016-08-03 DIAGNOSIS — E876 Hypokalemia: Secondary | ICD-10-CM

## 2016-08-03 DIAGNOSIS — I2699 Other pulmonary embolism without acute cor pulmonale: Secondary | ICD-10-CM | POA: Diagnosis not present

## 2016-08-03 DIAGNOSIS — E43 Unspecified severe protein-calorie malnutrition: Secondary | ICD-10-CM

## 2016-08-03 DIAGNOSIS — I10 Essential (primary) hypertension: Secondary | ICD-10-CM | POA: Diagnosis not present

## 2016-08-03 DIAGNOSIS — J189 Pneumonia, unspecified organism: Secondary | ICD-10-CM | POA: Diagnosis not present

## 2016-08-03 DIAGNOSIS — J9600 Acute respiratory failure, unspecified whether with hypoxia or hypercapnia: Secondary | ICD-10-CM

## 2016-08-03 DIAGNOSIS — Z955 Presence of coronary angioplasty implant and graft: Secondary | ICD-10-CM

## 2016-08-03 DIAGNOSIS — E872 Acidosis: Secondary | ICD-10-CM | POA: Diagnosis present

## 2016-08-03 DIAGNOSIS — E8770 Fluid overload, unspecified: Secondary | ICD-10-CM | POA: Diagnosis not present

## 2016-08-03 DIAGNOSIS — Z66 Do not resuscitate: Secondary | ICD-10-CM | POA: Diagnosis present

## 2016-08-03 DIAGNOSIS — G9340 Encephalopathy, unspecified: Secondary | ICD-10-CM

## 2016-08-03 DIAGNOSIS — Z885 Allergy status to narcotic agent status: Secondary | ICD-10-CM

## 2016-08-03 DIAGNOSIS — D696 Thrombocytopenia, unspecified: Secondary | ICD-10-CM | POA: Diagnosis present

## 2016-08-03 DIAGNOSIS — G894 Chronic pain syndrome: Secondary | ICD-10-CM | POA: Diagnosis not present

## 2016-08-03 DIAGNOSIS — J9601 Acute respiratory failure with hypoxia: Secondary | ICD-10-CM | POA: Diagnosis present

## 2016-08-03 DIAGNOSIS — I252 Old myocardial infarction: Secondary | ICD-10-CM

## 2016-08-03 DIAGNOSIS — R739 Hyperglycemia, unspecified: Secondary | ICD-10-CM | POA: Diagnosis not present

## 2016-08-03 DIAGNOSIS — Z886 Allergy status to analgesic agent status: Secondary | ICD-10-CM

## 2016-08-03 DIAGNOSIS — Z825 Family history of asthma and other chronic lower respiratory diseases: Secondary | ICD-10-CM

## 2016-08-03 DIAGNOSIS — Z681 Body mass index (BMI) 19 or less, adult: Secondary | ICD-10-CM | POA: Diagnosis not present

## 2016-08-03 DIAGNOSIS — J449 Chronic obstructive pulmonary disease, unspecified: Secondary | ICD-10-CM

## 2016-08-03 DIAGNOSIS — R609 Edema, unspecified: Secondary | ICD-10-CM

## 2016-08-03 DIAGNOSIS — Z79891 Long term (current) use of opiate analgesic: Secondary | ICD-10-CM

## 2016-08-03 DIAGNOSIS — R64 Cachexia: Secondary | ICD-10-CM | POA: Diagnosis present

## 2016-08-03 DIAGNOSIS — Z79899 Other long term (current) drug therapy: Secondary | ICD-10-CM

## 2016-08-03 DIAGNOSIS — R627 Adult failure to thrive: Secondary | ICD-10-CM | POA: Diagnosis not present

## 2016-08-03 DIAGNOSIS — Z888 Allergy status to other drugs, medicaments and biological substances status: Secondary | ICD-10-CM

## 2016-08-03 DIAGNOSIS — F17200 Nicotine dependence, unspecified, uncomplicated: Secondary | ICD-10-CM

## 2016-08-03 LAB — URINALYSIS, ROUTINE W REFLEX MICROSCOPIC
GLUCOSE, UA: NEGATIVE mg/dL
KETONES UR: 15 mg/dL — AB
LEUKOCYTES UA: NEGATIVE
NITRITE: NEGATIVE
PROTEIN: 100 mg/dL — AB
Specific Gravity, Urine: 1.01 (ref 1.005–1.030)
pH: 6 (ref 5.0–8.0)

## 2016-08-03 LAB — BLOOD GAS, ARTERIAL
ACID-BASE EXCESS: 4.1 mmol/L — AB (ref 0.0–2.0)
BICARBONATE: 28.1 mmol/L — AB (ref 20.0–28.0)
Drawn by: 234301
FIO2: 100
LHR: 16 {breaths}/min
O2 Saturation: 99 %
PATIENT TEMPERATURE: 37
PCO2 ART: 41.4 mmHg (ref 32.0–48.0)
PEEP/CPAP: 5 cmH2O
PO2 ART: 206 mmHg — AB (ref 83.0–108.0)
VT: 450 mL
pH, Arterial: 7.455 — ABNORMAL HIGH (ref 7.350–7.450)

## 2016-08-03 LAB — COMPREHENSIVE METABOLIC PANEL
ALK PHOS: 57 U/L (ref 38–126)
ALT: 10 U/L — ABNORMAL LOW (ref 14–54)
AST: 19 U/L (ref 15–41)
Albumin: 2.8 g/dL — ABNORMAL LOW (ref 3.5–5.0)
Anion gap: 13 (ref 5–15)
BILIRUBIN TOTAL: 1.7 mg/dL — AB (ref 0.3–1.2)
BUN: 27 mg/dL — AB (ref 6–20)
CALCIUM: 8.6 mg/dL — AB (ref 8.9–10.3)
CO2: 29 mmol/L (ref 22–32)
Chloride: 100 mmol/L — ABNORMAL LOW (ref 101–111)
Creatinine, Ser: 0.93 mg/dL (ref 0.44–1.00)
GFR calc Af Amer: >60 mL/min (ref >60)
GLUCOSE: 114 mg/dL — AB (ref 65–99)
POTASSIUM: 2.4 mmol/L — AB (ref 3.5–5.1)
Sodium: 142 mmol/L (ref 135–145)
TOTAL PROTEIN: 7.7 g/dL (ref 6.5–8.1)

## 2016-08-03 LAB — AMMONIA: AMMONIA: 13 umol/L (ref 9–35)

## 2016-08-03 LAB — CBC WITH DIFFERENTIAL/PLATELET
BASOS ABS: 0 10*3/uL (ref 0.0–0.1)
BASOS PCT: 0 %
EOS ABS: 0 10*3/uL (ref 0.0–0.7)
EOS PCT: 0 %
HCT: 40.3 % (ref 36.0–46.0)
Hemoglobin: 13.5 g/dL (ref 12.0–15.0)
LYMPHS PCT: 6 %
Lymphs Abs: 0.5 10*3/uL — ABNORMAL LOW (ref 0.7–4.0)
MCH: 30.8 pg (ref 26.0–34.0)
MCHC: 33.5 g/dL (ref 30.0–36.0)
MCV: 92 fL (ref 78.0–100.0)
MONO ABS: 0.5 10*3/uL (ref 0.1–1.0)
Monocytes Relative: 5 %
Neutro Abs: 8 10*3/uL — ABNORMAL HIGH (ref 1.7–7.7)
Neutrophils Relative %: 89 %
PLATELETS: 202 10*3/uL (ref 150–400)
RBC: 4.38 MIL/uL (ref 3.87–5.11)
RDW: 13.7 % (ref 11.5–15.5)
WBC: 9 10*3/uL (ref 4.0–10.5)

## 2016-08-03 LAB — URINE MICROSCOPIC-ADD ON

## 2016-08-03 LAB — LACTIC ACID, PLASMA
LACTIC ACID, VENOUS: 2.3 mmol/L — AB (ref 0.5–1.9)
Lactic Acid, Venous: 1.5 mmol/L (ref 0.5–1.9)

## 2016-08-03 LAB — I-STAT CG4 LACTIC ACID, ED: Lactic Acid, Venous: 2.49 mmol/L (ref 0.5–1.9)

## 2016-08-03 LAB — TRIGLYCERIDES: Triglycerides: 92 mg/dL (ref <150)

## 2016-08-03 LAB — INFLUENZA PANEL BY PCR (TYPE A & B)
INFLBPCR: NEGATIVE
Influenza A By PCR: NEGATIVE

## 2016-08-03 LAB — BRAIN NATRIURETIC PEPTIDE: B Natriuretic Peptide: 166 pg/mL — ABNORMAL HIGH (ref 0.0–100.0)

## 2016-08-03 MED ORDER — SODIUM CHLORIDE 0.9 % IV SOLN: 30.0000 meq | Freq: Once | INTRAVENOUS | Status: DC

## 2016-08-03 MED ORDER — ETOMIDATE 2 MG/ML IV SOLN
7.5000 mg | Freq: Once | INTRAVENOUS | Status: AC
  Administered 2016-08-03: 7.5 mg via INTRAVENOUS

## 2016-08-03 MED ORDER — SODIUM CHLORIDE 0.9 % IV BOLUS (SEPSIS)
1000.0000 mL | Freq: Once | INTRAVENOUS | Status: AC
  Administered 2016-08-03: 1000 mL via INTRAVENOUS

## 2016-08-03 MED ORDER — LACTATED RINGERS IV SOLN
INTRAVENOUS | Status: DC
  Administered 2016-08-04 – 2016-08-06 (×3): via INTRAVENOUS

## 2016-08-03 MED ORDER — ORAL CARE MOUTH RINSE
15.0000 mL | Freq: Four times a day (QID) | OROMUCOSAL | Status: DC
  Administered 2016-08-04 – 2016-08-08 (×17): 15 mL via OROMUCOSAL

## 2016-08-03 MED ORDER — PROPOFOL 1000 MG/100ML IV EMUL
INTRAVENOUS | Status: AC
  Filled 2016-08-03: qty 100

## 2016-08-03 MED ORDER — CHLORHEXIDINE GLUCONATE 0.12% ORAL RINSE (MEDLINE KIT)
15.0000 mL | Freq: Two times a day (BID) | OROMUCOSAL | Status: DC
  Administered 2016-08-04 – 2016-08-09 (×9): 15 mL via OROMUCOSAL

## 2016-08-03 MED ORDER — ACETAMINOPHEN 650 MG RE SUPP
RECTAL | Status: AC
  Administered 2016-08-03: 650 mg
  Filled 2016-08-03: qty 1

## 2016-08-03 MED ORDER — LEVOTHYROXINE SODIUM 100 MCG PO TABS
100.0000 ug | ORAL_TABLET | Freq: Every day | ORAL | Status: DC
  Administered 2016-08-04: 100 ug
  Filled 2016-08-03: qty 1

## 2016-08-03 MED ORDER — POTASSIUM CHLORIDE 10 MEQ/100ML IV SOLN
10.0000 meq | INTRAVENOUS | Status: AC
  Administered 2016-08-03 (×2): 10 meq via INTRAVENOUS
  Filled 2016-08-03: qty 100

## 2016-08-03 MED ORDER — CLOPIDOGREL BISULFATE 75 MG PO TABS
75.0000 mg | ORAL_TABLET | Freq: Every day | ORAL | Status: DC
  Administered 2016-08-04 – 2016-08-06 (×3): 75 mg
  Filled 2016-08-03 (×4): qty 1

## 2016-08-03 MED ORDER — SODIUM CHLORIDE 0.9 % IV SOLN: 250.0000 mL | INTRAVENOUS | Status: DC | PRN

## 2016-08-03 MED ORDER — POTASSIUM CHLORIDE 2 MEQ/ML IV SOLN
30.0000 meq | Freq: Once | INTRAVENOUS | Status: AC
  Administered 2016-08-04: 30 meq via INTRAVENOUS
  Filled 2016-08-03: qty 15

## 2016-08-03 MED ORDER — SODIUM CHLORIDE 0.9 % IV BOLUS (SEPSIS)
250.0000 mL | Freq: Once | INTRAVENOUS | Status: AC
  Administered 2016-08-03: 250 mL via INTRAVENOUS

## 2016-08-03 MED ORDER — SUCCINYLCHOLINE CHLORIDE 20 MG/ML IJ SOLN
100.0000 mg | Freq: Once | INTRAMUSCULAR | Status: AC
  Administered 2016-08-03: 100 mg via INTRAVENOUS

## 2016-08-03 MED ORDER — VANCOMYCIN HCL IN DEXTROSE 1-5 GM/200ML-% IV SOLN
1000.0000 mg | Freq: Once | INTRAVENOUS | Status: AC
  Administered 2016-08-03: 1000 mg via INTRAVENOUS
  Filled 2016-08-03: qty 200

## 2016-08-03 MED ORDER — ALBUTEROL SULFATE (2.5 MG/3ML) 0.083% IN NEBU
5.0000 mg | INHALATION_SOLUTION | Freq: Once | RESPIRATORY_TRACT | Status: AC
  Administered 2016-08-03: 5 mg via RESPIRATORY_TRACT
  Filled 2016-08-03: qty 6

## 2016-08-03 MED ORDER — PIPERACILLIN-TAZOBACTAM 3.375 G IVPB
3.3750 g | Freq: Three times a day (TID) | INTRAVENOUS | Status: DC
  Administered 2016-08-04 – 2016-08-05 (×5): 3.375 g via INTRAVENOUS
  Filled 2016-08-03 (×5): qty 50

## 2016-08-03 MED ORDER — PANTOPRAZOLE SODIUM 40 MG IV SOLR: 40.0000 mg | Freq: Every day | INTRAVENOUS | Status: DC

## 2016-08-03 MED ORDER — FENTANYL CITRATE (PF) 100 MCG/2ML IJ SOLN
25.0000 ug | INTRAMUSCULAR | Status: DC | PRN
  Administered 2016-08-04 – 2016-08-05 (×5): 50 ug via INTRAVENOUS
  Filled 2016-08-03 (×5): qty 2

## 2016-08-03 MED ORDER — HEPARIN SODIUM (PORCINE) 5000 UNIT/ML IJ SOLN
5000.0000 [IU] | Freq: Three times a day (TID) | INTRAMUSCULAR | Status: DC
  Administered 2016-08-04 – 2016-08-05 (×6): 5000 [IU] via SUBCUTANEOUS
  Filled 2016-08-03 (×6): qty 1

## 2016-08-03 MED ORDER — FENTANYL CITRATE (PF) 100 MCG/2ML IJ SOLN: 50.0000 ug | INTRAMUSCULAR | Status: DC | PRN

## 2016-08-03 MED ORDER — DOXYCYCLINE HYCLATE 100 MG IV SOLR
100.0000 mg | Freq: Two times a day (BID) | INTRAVENOUS | Status: DC
  Administered 2016-08-04 (×2): 100 mg via INTRAVENOUS
  Filled 2016-08-03 (×2): qty 100

## 2016-08-03 MED ORDER — ETOMIDATE 2 MG/ML IV SOLN: 0.3000 mg/kg | Freq: Once | INTRAVENOUS | Status: DC

## 2016-08-03 MED ORDER — PROPOFOL 1000 MG/100ML IV EMUL
0.0000 ug/kg/min | INTRAVENOUS | Status: DC
  Administered 2016-08-03: 5 ug/kg/min via INTRAVENOUS
  Administered 2016-08-04: 40 ug/kg/min via INTRAVENOUS
  Administered 2016-08-04: 50 ug/kg/min via INTRAVENOUS
  Administered 2016-08-04: 25 ug/kg/min via INTRAVENOUS
  Filled 2016-08-03 (×5): qty 100

## 2016-08-03 MED ORDER — PANTOPRAZOLE SODIUM 40 MG PO PACK
40.0000 mg | PACK | Freq: Every day | ORAL | Status: DC
  Administered 2016-08-04 – 2016-08-06 (×3): 40 mg
  Filled 2016-08-03 (×3): qty 20

## 2016-08-03 MED ORDER — PIPERACILLIN-TAZOBACTAM 3.375 G IVPB 30 MIN
3.3750 g | Freq: Once | INTRAVENOUS | Status: AC
  Administered 2016-08-03: 3.375 g via INTRAVENOUS
  Filled 2016-08-03: qty 50

## 2016-08-03 MED ORDER — METHYLPREDNISOLONE SODIUM SUCC 125 MG IJ SOLR
125.0000 mg | Freq: Once | INTRAMUSCULAR | Status: AC
  Administered 2016-08-03: 125 mg via INTRAVENOUS
  Filled 2016-08-03: qty 2

## 2016-08-03 MED ORDER — FENTANYL CITRATE (PF) 100 MCG/2ML IJ SOLN
50.0000 ug | INTRAMUSCULAR | Status: DC | PRN
  Administered 2016-08-03: 50 ug via INTRAVENOUS
  Filled 2016-08-03: qty 2

## 2016-08-03 NOTE — ED Notes (Signed)
Pt difficult to sedate. Propofol titrated per order.

## 2016-08-03 NOTE — ED Notes (Signed)
Attempted to pull I-stat lactic sample from IV site in left forearm. Unable to obtain.

## 2016-08-03 NOTE — H&P (Addendum)
PULMONARY / CRITICAL CARE MEDICINE   Name: Olivia GaussMartha A Salvato MRN: [id number] DOB: December 25, 1955    ADMISSION DATE:  08/03/2016 CONSULTATION DATE:  08/03/2016  REFERRING MD:  Margarita Grizzleanielle Ray, M.D. / EDP [facility]  CHIEF COMPLAINT:  Acute Hypoxic Respiratory Failure with Pneumonia  HISTORY OF PRESENT ILLNESS:  60 y.o. female currently intubated. History obtained from the patient's electronic medical record. Patient with known history of chronic pain on chronic pain medication as well as supraventricular tachycardia. Patient reportedly in her usual state of health last Friday when last seen. Patient currently living alone. At that time patient was complaining of feeling as though she had a "virus" with some coughing. Her friend had to have management (her door today when she arrived. Patient was found sitting on her couch covered in stool. Patient was responsive with only eye-opening to her name and was only able to follow some commands without giving any history in the emergency department. Patient required nonrebreather mask to improve her desaturation but with ongoing confusion and no close family or friends otherwise the decision was made to intubate the patient. Transfer was requested to our facility for ongoing treatment of her acute hypoxic respiratory failure. The patient's niece showed up at the end of my exam reporting that the patient is very secretive about her medical health and history. Reportedly she has been undergoing some form of treatment here in [place] but we are unsure regarding what form of treatment or exactly where this is being undertaken. She reports she last saw her aunt normal just prior to Thanksgiving.  PAST MEDICAL HISTORY :  Past Medical History:  Diagnosis Date  . Arthritis   . Bladder wall thickening 11/15/2012  . Chronic pain syndrome   . Enteritis 11/15/2012  . Hypertension   . Hypokalemia 11/15/2012  . Hypothyroidism   . IBS (irritable bowel syndrome)  11/19/2012  . Non-ST elevation MI (NSTEMI) (HCC) 11/16/2012   Preserved LV function; septal hypokinesis per echo.  . Non-sustained ventricular tachycardia (HCC) 11/16/2012    PAST SURGICAL HISTORY: No known prior surgeries.  Allergies  Allergen Reactions  . Aspirin Rash  . Calcium-Containing Compounds Rash  . Codeine Rash  . Tagamet [Cimetidine] Rash    No current facility-administered medications on file prior to encounter.    Current Outpatient Prescriptions on File Prior to Encounter  Medication Sig  . diazepam (VALIUM) 10 MG tablet Take 10 mg by mouth every 8 (eight) hours as needed for anxiety.  . clopidogrel (PLAVIX) 75 MG tablet Take 1 tablet (75 mg total) by mouth daily. For treatment of your heart attack. Stop this medicine if you have rectal bleeding or black stools.  Marland Kitchen. levothyroxine (SYNTHROID, LEVOTHROID) 100 MCG SOLR injection At previous dose per Dr. Leslie DalesAltheimer.  . pantoprazole (PROTONIX) 40 MG tablet Take 1 tablet (40 mg total) by mouth daily. For protection of your stomach lining.  . potassium chloride 20 MEQ/15ML (10%) solution Take 30 mLs (40 mEq total) by mouth 2 (two) times daily.    FAMILY HISTORY:  Family History  Problem Relation Age of Onset  . Cancer Mother     bone  . COPD Father     SOCIAL HISTORY: Social History   Social History  . Marital status: Single    Spouse name: N/A  . Number of children: N/A  . Years of education: N/A   Social History Main Topics  . Smoking status: Former Smoker    Packs/day: 0.75    Years: 35.00  Quit date: 11/15/2008  . Smokeless tobacco: Former NeurosurgeonUser  . Alcohol use No  . Drug use: No  . Sexual activity: Not Currently   Other Topics Concern  . None   Social History Narrative  . None    REVIEW OF SYSTEMS:  Unable to obtain as patient is intubated and sedated.  SUBJECTIVE:  As above.  VITAL SIGNS: BP 143/93   Pulse 104   Temp 99.9 F (37.7 C)   Resp 16   Ht 5\' 2"  (1.575 m)   Wt 76 lb (34.5 kg)    SpO2 96%   BMI 13.90 kg/m   HEMODYNAMICS:    VENTILATOR SETTINGS: Vent Mode: PRVC FiO2 (%):  [60 %-100 %] 60 % Set Rate:  [16 bmp] 16 bmp Vt Set:  [450 mL] 450 mL PEEP:  [5 cmH20] 5 cmH20 Plateau Pressure:  [15 cmH20-18 cmH20] 15 cmH20  INTAKE / OUTPUT: I/O last 3 completed shifts: In: -  Out: 13 [Urine:13]  PHYSICAL EXAMINATION: General:  Thin, cachectic female. No acute distress. Niece at bedside.  Integument:  Warm & dry. No rash on exposed skin. No bruising. Lymphatics:  No appreciated cervical or supraclavicular lymphadenoapthy. HEENT:  No scleral injection or icterus. Endotracheal tube in place.  Cardiovascular:  Regular rhythm. No edema. No appreciable JVD.  Pulmonary:  Coarse breath sounds bilaterally. Symmetric chest wall rise on ventilator. Abdomen: Soft. Normal bowel sounds. Nondistended.  Musculoskeletal:  Normal bulk and tone. No joint deformity or effusion appreciated. Neurological:  Sedated. His pupils symmetric. No spontaneous movements. Psychiatric:  Unable to assess given sedation and endotracheal intubation.  LABS:  BMET  Recent Labs Lab 08/03/16 1549  NA 142  K 2.4*  CL 100*  CO2 29  BUN 27*  CREATININE 0.93  GLUCOSE 114*    Electrolytes  Recent Labs Lab 08/03/16 1549  CALCIUM 8.6*    CBC  Recent Labs Lab 08/03/16 1549  WBC 9.0  HGB 13.5  HCT 40.3  PLT 202    Coag's No results for input(s): APTT, INR in the last 168 hours.  Sepsis Markers  Recent Labs Lab 08/03/16 1604 08/03/16 1844 08/03/16 2127  LATICACIDVEN 2.49* 2.3* 1.5    ABG  Recent Labs Lab 08/03/16 1855  PHART 7.455*  PCO2ART 41.4  PO2ART 206.00*    Liver Enzymes  Recent Labs Lab 08/03/16 1549  AST 19  ALT 10*  ALKPHOS 57  BILITOT 1.7*  ALBUMIN 2.8*    Cardiac Enzymes No results for input(s): TROPONINI, PROBNP in the last 168 hours.  Glucose No results for input(s): GLUCAP in the last 168 hours.  Imaging Dg Chest 1  View  Result Date: 08/03/2016 CLINICAL DATA:  Post intubation EXAM: CHEST 1 VIEW COMPARISON:  08/03/2016 FINDINGS: Endotracheal tube just below the carina in the right main bronchus. Recommend withdrawal 4 cm. Port-A-Cath tip in the SVC Chronic lung disease. Diffuse bilateral airspace disease which has progressed on the right. Probable pneumonia. No significant effusion. IMPRESSION: Endotracheal tube in right main bronchus.  Recommend withdrawal 4 cm Progression of infiltrate on the right most likely due to pneumonia. Diffuse bilateral airspace disease. These results will be called to the ordering clinician or representative by the Radiologist Assistant, and communication documented in the PACS or zVision Dashboard. Electronically Signed   By: Marlan Palauharles  Clark M.D.   On: 08/03/2016 18:26   Dg Chest Port 1 View  Result Date: 08/03/2016 CLINICAL DATA:  Altered mental status.  Hypoxia. EXAM: PORTABLE CHEST 1 VIEW COMPARISON:  11/16/2012 FINDINGS: COPD with pulmonary hyperinflation. Diffuse bilateral airspace disease which may be due to pneumonia or edema. This was not present previously. No effusion. Heart size normal. Port-A-Cath tip in the proximal SVC unchanged. IMPRESSION: COPD with diffuse bilateral airspace disease. Diffuse pneumonia versus heart failure however the heart is not enlarged. Electronically Signed   By: Marlan Palau M.D.   On: 08/03/2016 16:29     STUDIES:  EKG 11/28:  Sinus Tachycardia. QTc . Port CXR 11/28: Personally reviewed by me. Hyperinflation. Increased interstitial markings bilaterally with patchy opacification in the right mid and lower lung zone. Right main stem intubation by endotracheal tube.  MICROBIOLOGY: Blood Ctx x2 11/28 >> Urine Ctx 11/28 >> Influenza A PCR 11/28:  Negative   ANTIBIOTICS: Vancomycin 11/28 >> Zosyn 11/28 >> Doxycycline 11/28 >>  SIGNIFICANT EVENTS: 11/28 - Presented to [facility] ED>>intubated for hypoxia/pneumonia>>transferred to  [facility]  LINES/TUBES: OETT 7.0 11/28 >> OGT 11/28 >> FOLEY 11/28 >> R Chest Port >> PIV  DISCUSSION:  60 y.o. female presenting with acute encephalopathy and acute hypoxic respiratory failure. Severe community acquired pneumonia with viral prodrome is most likely etiology but without a complete past medical history I cannot discount additional possible causes to the patient's clinical presentation.  ASSESSMENT / PLAN:  PULMONARY A: Acute Hypoxic Respiratory Failure - Likely due to pneumonia. H/O Tobacco Use Disorder  P:   Full Vent Support Checking Stat Portable CXR SBT & PS Weaning as mental status & clinical status improve  INFECTIOUS A:   Sepsis Severe Community Acquired Pneumonia  P:   Continuing Empiric Vancomycin & Zosyn Starting Doxycycline IV q12hr Checking Tracheal Aspirate culture, Respiratory Viral Panel PCR, Urine Streptococcal Antigen, & Urine Legionella Antigen Blood & Urine Cultures obtained at [facility] Trending Procalcitonin per Algorithm Droplet Isolation  NEUROLOGIC A:   Acute Encephalopathy - Likely multifactorial from toxic metabolic & hypoxia. Sedation on Ventilator Chronic Pain Syndrome  P:   RASS goal: 0 to -1 Propofol gtt Fentanyl IV prn Holding Fentanyl Duragesic Patch Checking UDS Stat  CARDIOVASCULAR A:  H/O HTN H/O Non-sustained V tach H/O NSTEMI  P:  Continuous Telemetry Monitoring  Vitals per Unit Protocol Goal MAP >65 Trending Troponin I q6hr x3  RENAL A:   Hypokalemia - Replaced in the ED. H/O hypokalemia as well. Lactic Acidosis - Resolved.  P:   Trending UOP with Foley Monitoring electrolytes and renal function daily Replacing electrolytes as indicated S/P KCl IV in [facility] ED Additional KCl IV  GASTROINTESTINAL A:   Diarrhea/Stool Incontinence - Likely due to encephalopathy. Severe Protein-Calorie Malnutrition H/O IBS  P:   NPO Protonix VT daily Consider Tube Feedings in the AM  HEMATOLOGIC A:    Thrombocytopenia - Mild.  P:  Trending cell counts daily w/ CBC SCDs Heparin [place] q8hr  ENDOCRINE A:   H/O Hypothyroidism Hyperglycemia - Mild. No h/o DM.  P:   Checking TSH & Free T4 Continuing Synthroid daily VT Checking Hgb A1c Accu-Checks q4hr with MD notification parameters    FAMILY  - Updates:  Niece updated at bedside 11/28 by Dr. Jamison Neighbor.  - Inter-disciplinary family meet or Palliative Care meeting due by:  12/5  I have spent a total of 36 minutes of critical care time today caring for the patient, updating her family at bedside, and reviewing the patient's electronic medical record.  Donna Christen Jamison Neighbor, M.D. [facility] Pulmonary [facility] Critical Care Pager:  [phone] After 3pm or if no response, call [phone] 08/03/2016, 11:08 PM

## 2016-08-03 NOTE — Progress Notes (Signed)
Pt received from [facility] via [facility] on [facility].  Pt placed on PRVC 450, 16, Peep 5 and 60%. Pt is tolerating well at this time, RT to monitor and assess as needed.

## 2016-08-03 NOTE — ED Triage Notes (Signed)
Pt here via EMS. State they were called out for pt failing to thrive. Pt not very cooperative regarding history. States her sister wants to put her in a nursing home.and she told her to go to hell.

## 2016-08-03 NOTE — ED Notes (Addendum)
Pt currently 100% on non-rebreather. EDP and ER resident at bedside preparing to intubate. RT and other ED staff at bedside. Suction and RSI medication at bedside.

## 2016-08-03 NOTE — Progress Notes (Signed)
Pharmacy Antibiotic Note  Jena GaussMartha A Lease is a [age] y.o. female admitted on 08/03/2016 with sepsis.  [facility]  Pt has small body habitus.  F/U labs, SCr.   Plan:  Vancomycin 500mg  IV q12h Check trough at steady state Zosyn 3.375gm IV q8h, EID Monitor labs, renal fxn, progress and c/s Deescalate ABX when improved / appropriate.       Temp (24hrs), Avg:98.9 F (37.2 C), Min:98.9 F (37.2 C), Max:98.9 F (37.2 C)  No results for input(s): WBC, CREATININE, LATICACIDVEN, VANCOTROUGH, VANCOPEAK, VANCORANDOM, GENTTROUGH, GENTPEAK, GENTRANDOM, TOBRATROUGH, TOBRAPEAK, TOBRARND, AMIKACINPEAK, AMIKACINTROU, AMIKACIN in the last 168 hours.  CrCl cannot be calculated (Patient's most recent lab result is older than the maximum 21 days allowed.).    Allergies  Allergen Reactions  . Aspirin Rash  . Calcium-Containing Compounds Rash  . Codeine Rash  . Tagamet [Cimetidine] Rash    Antimicrobials this admission: Vancomycin 11/28 >>  Zosyn 11/28 >>   Dose adjustments this admission:   Microbiology results:  BCx: pending  UCx: pending   Sputum:    MRSA PCR:   Thank you for allowing pharmacy to be a part of this patient's care.  Valrie HartHall, Makendra Vigeant A 08/03/2016 4:14 PM

## 2016-08-03 NOTE — ED Notes (Signed)
Attempted to access pt port. Unable to flush or obtain blood return. Will assess for peripheral sites for additional IV access.

## 2016-08-03 NOTE — ED Notes (Signed)
CRITICAL VALUE ALERT  Critical value received:  POTASSIUM  2.4  Date of notification:  08/03/16  Time of notification:  1647  Critical value read back: yes  Nurse who received alert:  Garald BraverJ. Vincie Linn rn  MD notified (1st page): Ray  Time of first page:  1648  MD notified (2nd page):  Time of second page:  Responding MD:  RAY  Time MD responded:  [phone]

## 2016-08-03 NOTE — ED Notes (Addendum)
ER resident at bedside reporting, ET tube in right bronchus and that ET will need to be adjusted. RT at bedside.

## 2016-08-03 NOTE — ED Provider Notes (Addendum)
[facility]-EMERGENCY DEPT Provider Note   CSN: [id number] Arrival date & time: 08/03/16  1529   Level V caveat- patient with decreased mental status  History   Chief Complaint Chief Complaint  Patient presents with  . Failure To Thrive    HPI [name] is a 60 y.o. female.  HPI  History obtained from friend and discharge This is a 60 year old female brought in today via EMS. He has a complicated past medical history including hypothyroidism for which she has been receiving IV Synthroid weekly. She also has a history of chronic pain and uses fentanyl patches. She has a history of an MI and ventricular tachycardia as well as hypokalemia. She has a port in her right chest for receiving the IV Synthroid. According to her friend she was in her usual state of health last Friday when her friend last saw her. Her usual state of health is living alone, driving, and doing all of her activities of daily living. Friend states that at that time, she complained of feeling like she had a virus with some coughing.  The friend did not hear from her over the rest of the weekend and went to check on her today. When she arrived, she was unable to come to the door and she had to get management to unlock the door. She found the patient sitting on her couch. She had stool on her. She was somewhat responsive to her friend but it was unclear when she had last been up or had anything to eat or drink. Here the patient is responsive only with eye opening to her name and follows some commands but is unable to give me any history or tell me any complaints.  Past Medical History:  Diagnosis Date  . Arthritis   . Bladder wall thickening 11/15/2012  . Chronic pain syndrome   . Enteritis 11/15/2012  . Hypertension   . Hypokalemia 11/15/2012  . Hypothyroidism   . IBS (irritable bowel syndrome) 11/19/2012  . Non-ST elevation MI (NSTEMI) (HCC) 11/16/2012   Preserved LV function; septal hypokinesis per echo.  . Non-sustained  ventricular tachycardia (HCC) 11/16/2012    Patient Active Problem List   Diagnosis Date Noted  . Bradycardia 11/19/2012  . IBS (irritable bowel syndrome) 11/19/2012  . UTI (urinary tract infection) 11/17/2012  . Anemia 11/17/2012  . Hypothyroidism (acquired) 11/16/2012  . Non-ST elevation MI (NSTEMI) (HCC) 11/16/2012  . Non-sustained ventricular tachycardia (HCC) 11/16/2012  . Hyperglycemia 11/16/2012  . Hypokalemia 11/15/2012  . Colitis 11/15/2012  . Abdominal pain 11/15/2012  . Dehydration 11/15/2012  . Encephalopathy, metabolic 11/15/2012  . Thyroiditis, chronic, lymphocytic 11/15/2012  . Enteritis 11/15/2012  . Bladder wall thickening 11/15/2012  . Chronic pain syndrome 11/15/2012  . Hematuria, microscopic 11/15/2012    History reviewed. No pertinent surgical history.  OB History    No data available       Home Medications    Prior to Admission medications   Medication Sig Start Date End Date Taking? Authorizing Provider  ciprofloxacin (CIPRO) 500 MG tablet Take 1 tablet (500 mg total) by mouth 2 (two) times daily. Antibiotic for treatment of your infection. 11/19/12   Elliot Cousinenise Fisher, MD  clopidogrel (PLAVIX) 75 MG tablet Take 1 tablet (75 mg total) by mouth daily. For treatment of your heart attack. Stop this medicine if you have rectal bleeding or black stools. 11/19/12   Elliot Cousinenise Fisher, MD  diazepam (VALIUM) 10 MG tablet Take 10 mg by mouth every 8 (eight) hours as  needed for anxiety.    Historical Provider, MD  fentaNYL (DURAGESIC - DOSED MCG/HR) 75 MCG/HR Place 2 patches (150 mcg total) onto the skin every other day. 11/19/12   Elliot Cousinenise Fisher, MD  levothyroxine (SYNTHROID, LEVOTHROID) 100 MCG SOLR injection At previous dose per Dr. Leslie DalesAltheimer. 11/19/12   Elliot Cousinenise Fisher, MD  metroNIDAZOLE (FLAGYL) 250 MG tablet Take 1 tablet (250 mg total) by mouth every 8 (eight) hours. For continued treatment of your infection. 11/19/12   Elliot Cousinenise Fisher, MD  pantoprazole (PROTONIX) 40 MG  tablet Take 1 tablet (40 mg total) by mouth daily. For protection of your stomach lining. 11/19/12   Elliot Cousinenise Fisher, MD  potassium chloride 20 MEQ/15ML (10%) solution Take 30 mLs (40 mEq total) by mouth 2 (two) times daily. 11/19/12   Elliot Cousinenise Fisher, MD    Family History Family History  Problem Relation Age of Onset  . Cancer Mother     bone  . COPD Father     Social History Social History  Substance Use Topics  . Smoking status: Former Smoker    Packs/day: 0.75    Years: 35.00    Quit date: 11/15/2008  . Smokeless tobacco: Former NeurosurgeonUser  . Alcohol use No     Allergies   Aspirin; Calcium-containing compounds; Codeine; and Tagamet [cimetidine]   Review of Systems Review of Systems  Unable to perform ROS: Acuity of condition     Physical Exam Updated Vital Signs BP 164/96   Pulse 105   Temp 98.9 F (37.2 C)   Resp 20   SpO2 98%   Physical Exam  Constitutional: She appears well-developed.  Cachectic female who appears much older than stated age  HENT:  Head: Normocephalic and atraumatic.  Right Ear: External ear normal.  Left Ear: External ear normal.  Nose: Nose normal.  Mouth/Throat: Oropharynx is clear and moist.  Eyes:  Eyes are 4 mm bilaterally and reactive  Neck: Normal range of motion. Neck supple. No JVD present. No tracheal deviation present.  Cardiovascular: Tachycardia present.   Pulmonary/Chest:  Probable port right chest and fentanyl patch right chest removed Course breath sounds throughout no wheezing noted  Abdominal: Soft. Bowel sounds are normal.  Mild diffuse tenderness to palpation patient groans on palpation  Musculoskeletal: She exhibits no edema or deformity.  Neurological:  Patient with some eye-opening to verbal stimuli and follows some directions No lateralized deficits appreciated  Skin: Skin is warm.  Nursing note and vitals reviewed.    ED Treatments / Results  Labs (all labs ordered are listed, but only abnormal results are  displayed) Labs Reviewed  COMPREHENSIVE METABOLIC PANEL - Abnormal; Notable for the following:       Result Value   Potassium 2.4 (*)    Chloride 100 (*)    Glucose, Bld 114 (*)    BUN 27 (*)    Calcium 8.6 (*)    Albumin 2.8 (*)    ALT 10 (*)    Total Bilirubin 1.7 (*)    All other components within normal limits  CBC WITH DIFFERENTIAL/PLATELET - Abnormal; Notable for the following:    Neutro Abs 8.0 (*)    Lymphs Abs 0.5 (*)    All other components within normal limits  BRAIN NATRIURETIC PEPTIDE - Abnormal; Notable for the following:    B Natriuretic Peptide 166.0 (*)    All other components within normal limits  I-STAT CG4 LACTIC ACID, ED - Abnormal; Notable for the following:    Lactic Acid, Venous 2.49 (*)  All other components within normal limits  CULTURE, BLOOD (ROUTINE X 2)  CULTURE, BLOOD (ROUTINE X 2)  URINE CULTURE  AMMONIA  BLOOD GAS, ARTERIAL  URINALYSIS, ROUTINE W REFLEX MICROSCOPIC (NOT AT [facility])  BASIC METABOLIC PANEL  TRIGLYCERIDES    EKG  EKG Interpretation  Date/Time:  Tuesday August 03 2016 15:49:58 EST Ventricular Rate:  114 PR Interval:    QRS Duration: 93 QT Interval:  332 QTC Calculation: 458 R Axis:   85 Text Interpretation:  Sinus tachycardia Non-specific ST-t changes SINCE LAST TRACING HEART RATE HAS INCREASED Confirmed by Camarion Weier MD, Duwayne Heck [phone]) on 08/03/2016 5:51:37 PM       Radiology Dg Chest Port 1 View  Result Date: 08/03/2016 CLINICAL DATA:  Altered mental status.  Hypoxia. EXAM: PORTABLE CHEST 1 VIEW COMPARISON:  11/16/2012 FINDINGS: COPD with pulmonary hyperinflation. Diffuse bilateral airspace disease which may be due to pneumonia or edema. This was not present previously. No effusion. Heart size normal. Port-A-Cath tip in the proximal SVC unchanged. IMPRESSION: COPD with diffuse bilateral airspace disease. Diffuse pneumonia versus heart failure however the heart is not enlarged. Electronically Signed   By: Marlan Palau  M.D.   On: 08/03/2016 16:29    Procedures Procedures (including critical care time)  Medications Ordered in ED Medications  vancomycin (VANCOCIN) IVPB 1000 mg/200 mL premix (not administered)  piperacillin-tazobactam (ZOSYN) IVPB 3.375 g (not administered)  propofol (DIPRIVAN) 1000 MG/100ML infusion (not administered)  propofol (DIPRIVAN) 1000 MG/100ML infusion (not administered)  fentaNYL (SUBLIMAZE) injection 50 mcg (not administered)  fentaNYL (SUBLIMAZE) injection 50 mcg (not administered)  potassium chloride 10 mEq in 100 mL IVPB (not administered)  piperacillin-tazobactam (ZOSYN) IVPB 3.375 g (0 g Intravenous Stopped 08/03/16 1705)  succinylcholine (ANECTINE) injection 100 mg (100 mg Intravenous Given 08/03/16 1735)  etomidate (AMIDATE) injection 7.5 mg (7.5 mg Intravenous Given 08/03/16 1734)     Initial Impression / Assessment and Plan / ED Course  I have reviewed the triage vital signs and the nursing notes.  Pertinent labs & imaging results that were available during my care of the patient were reviewed by me and considered in my medical decision making (see chart for details).  Clinical Course   Patient with low oxygen saturations in 70s increased to 80s with nrb and patient continues to remove nrb with ongoing confusion.  Discussed with friend, who states patient has no children and is not close with her siblings.  She does not know of patient expressing any wishes regarding her care.  INTUBATION Performed by: Hilario Quarry  Required items: required blood products, implants, devices, and special equipment available Patient identity confirmed: provided demographic data and hospital-assigned identification number Time out: Immediately prior to procedure a "time out" was called to verify the correct patient, procedure, equipment, support staff and site/side marked as required.  Indications: hypoxia, ams  Intubation method: Glidescope Laryngoscopy   Preoxygenation:  BVM and Goree  Sedatives: 7.5Etomidate Paralytic: 100Succinylcholine  Tube Size: 7.5 cuffed  Post-procedure assessment: chest rise and ETCO2 monitor Breath sounds: equal and absent over the epigastrium Tube secured with: ETT holder Chest x-Faysal Fenoglio interpreted by radiologist and me.  Chest x-Nicholad Kautzman findings: endotracheal tube in appropriate position? Right mainstem- pulled back 2 cm  Patient tolerated the procedure well with no immediate complications.   1- lung infiltrates- suspect infecion given history and smoker. 2- hypokalemia- iv replenishment 3- hypothyroidism- patient has been receiving iv synthroid. 4- chronic narcotic use- patient with fentanyl as part of sedation.   This patient meets  SIRS Criteria and may be septic. SIRS = Systemic Inflammatory Response Syndrome  Sepsis - Repeat Assessment  Performed at:    6:26 PM   Vitals     Blood pressure 164/96, pulse 105, temperature 98.9 F (37.2 C), resp. rate 20, SpO2 98 %.  Heart:     Tachycardic  Lungs:    Rhonchi  Capillary Refill:   > 2 sec  Peripheral Pulse:   Radial pulse palpable  Skin:     Pale    The recent clinical data is shown below. Vitals:   08/03/16 1544 08/03/16 1633 08/03/16 1706 08/03/16 1733  BP:  150/94 152/91 164/96  Pulse:  111 106 105  Resp:  26 19 20   Temp:      SpO2: (!) 82% 91% 93% 98%    Discussed with Dr. Nicholos Johns and patient accepted in transfer to ICU  Final Clinical Impressions(s) / ED Diagnoses   Final diagnoses:  Acute respiratory failure with hypoxia (HCC)  Hypokalemia  Community acquired pneumonia, unspecified laterality    CRITICAL CARE Performed by: Hilario Quarry Total critical care time: 75 minutes Critical care time was exclusive of separately billable procedures and treating other patients. Critical care was necessary to treat or prevent imminent or life-threatening deterioration. Critical care was time spent personally by me on the following activities:  development of treatment plan with patient and/or surrogate as well as nursing, discussions with consultants, evaluation of patient's response to treatment, examination of patient, obtaining history from patient or surrogate, ordering and performing treatments and interventions, ordering and review of laboratory studies, ordering and review of radiographic studies, pulse oximetry and re-evaluation of patient's condition.   Margarita Grizzle, MD 08/03/16 8295    Margarita Grizzle, MD 08/03/16 [phone]

## 2016-08-03 NOTE — ED Notes (Signed)
CRITICAL VALUE ALERT  Critical value received:  Lactic Acid 2.3   Date of notification:  08/03/16  Time of notification:  2000 hrs  Critical value read back:Yes.    Nurse who received alert:  Y. Patton Swisher, RN  Responding MD:  Dr. Rosalia Hammersay  Time MD responded:  2001 hrs

## 2016-08-03 NOTE — ED Notes (Signed)
Pt intubated at 1738.  7 ET tube,24cm @ lip. Verbal order for NG/OG, chest xray post intubation.

## 2016-08-04 DIAGNOSIS — R64 Cachexia: Secondary | ICD-10-CM

## 2016-08-04 DIAGNOSIS — J449 Chronic obstructive pulmonary disease, unspecified: Secondary | ICD-10-CM

## 2016-08-04 DIAGNOSIS — E43 Unspecified severe protein-calorie malnutrition: Secondary | ICD-10-CM

## 2016-08-04 LAB — GLUCOSE, CAPILLARY
GLUCOSE-CAPILLARY: 133 mg/dL — AB (ref 65–99)
GLUCOSE-CAPILLARY: 186 mg/dL — AB (ref 65–99)
Glucose-Capillary: 121 mg/dL — ABNORMAL HIGH (ref 65–99)
Glucose-Capillary: 130 mg/dL — ABNORMAL HIGH (ref 65–99)
Glucose-Capillary: 145 mg/dL — ABNORMAL HIGH (ref 65–99)
Glucose-Capillary: 157 mg/dL — ABNORMAL HIGH (ref 65–99)
Glucose-Capillary: 218 mg/dL — ABNORMAL HIGH (ref 65–99)

## 2016-08-04 LAB — RESPIRATORY PANEL BY PCR
ADENOVIRUS-RVPPCR: NOT DETECTED
Bordetella pertussis: NOT DETECTED
CORONAVIRUS NL63-RVPPCR: NOT DETECTED
CORONAVIRUS OC43-RVPPCR: NOT DETECTED
Chlamydophila pneumoniae: NOT DETECTED
Coronavirus 229E: NOT DETECTED
Coronavirus HKU1: NOT DETECTED
INFLUENZA A-RVPPCR: NOT DETECTED
INFLUENZA B-RVPPCR: NOT DETECTED
METAPNEUMOVIRUS-RVPPCR: NOT DETECTED
Mycoplasma pneumoniae: NOT DETECTED
PARAINFLUENZA VIRUS 1-RVPPCR: NOT DETECTED
PARAINFLUENZA VIRUS 2-RVPPCR: NOT DETECTED
PARAINFLUENZA VIRUS 3-RVPPCR: NOT DETECTED
PARAINFLUENZA VIRUS 4-RVPPCR: NOT DETECTED
RESPIRATORY SYNCYTIAL VIRUS-RVPPCR: NOT DETECTED
RHINOVIRUS / ENTEROVIRUS - RVPPCR: NOT DETECTED

## 2016-08-04 LAB — CBC
HEMATOCRIT: 36.1 % (ref 36.0–46.0)
HEMOGLOBIN: 12.1 g/dL (ref 12.0–15.0)
MCH: 30.6 pg (ref 26.0–34.0)
MCHC: 33.5 g/dL (ref 30.0–36.0)
MCV: 91.4 fL (ref 78.0–100.0)
Platelets: 187 10*3/uL (ref 150–400)
RBC: 3.95 MIL/uL (ref 3.87–5.11)
RDW: 14.1 % (ref 11.5–15.5)
WBC: 6.9 10*3/uL (ref 4.0–10.5)

## 2016-08-04 LAB — RAPID URINE DRUG SCREEN, HOSP PERFORMED
AMPHETAMINES: NOT DETECTED
BENZODIAZEPINES: POSITIVE — AB
Barbiturates: NOT DETECTED
COCAINE: NOT DETECTED
Opiates: POSITIVE — AB
Tetrahydrocannabinol: NOT DETECTED

## 2016-08-04 LAB — TSH: TSH: 0.012 u[IU]/mL — ABNORMAL LOW (ref 0.350–4.500)

## 2016-08-04 LAB — BASIC METABOLIC PANEL
ANION GAP: 11 (ref 5–15)
BUN: 32 mg/dL — ABNORMAL HIGH (ref 6–20)
CALCIUM: 8.1 mg/dL — AB (ref 8.9–10.3)
CO2: 25 mmol/L (ref 22–32)
CREATININE: 0.93 mg/dL (ref 0.44–1.00)
Chloride: 110 mmol/L (ref 101–111)
Glucose, Bld: 146 mg/dL — ABNORMAL HIGH (ref 65–99)
Potassium: 3.6 mmol/L (ref 3.5–5.1)
SODIUM: 146 mmol/L — AB (ref 135–145)

## 2016-08-04 LAB — MRSA PCR SCREENING: MRSA by PCR: NEGATIVE

## 2016-08-04 LAB — TROPONIN I: Troponin I: <0.03 ng/mL (ref <0.03)

## 2016-08-04 LAB — MAGNESIUM: Magnesium: 2.2 mg/dL (ref 1.7–2.4)

## 2016-08-04 LAB — PROCALCITONIN: Procalcitonin: 4.94 ng/mL

## 2016-08-04 LAB — STREP PNEUMONIAE URINARY ANTIGEN: STREP PNEUMO URINARY ANTIGEN: POSITIVE — AB

## 2016-08-04 LAB — T4, FREE: Free T4: 0.89 ng/dL (ref 0.61–1.12)

## 2016-08-04 LAB — PHOSPHORUS: Phosphorus: 4.3 mg/dL (ref 2.5–4.6)

## 2016-08-04 MED ORDER — METOPROLOL TARTRATE 5 MG/5ML IV SOLN
5.0000 mg | Freq: Once | INTRAVENOUS | Status: AC
  Administered 2016-08-04: 5 mg via INTRAVENOUS
  Filled 2016-08-04: qty 5

## 2016-08-04 MED ORDER — FREE WATER
200.0000 mL | Freq: Three times a day (TID) | Status: DC
  Administered 2016-08-04 – 2016-08-05 (×3): 200 mL

## 2016-08-04 MED ORDER — INSULIN ASPART 100 UNIT/ML ~~LOC~~ SOLN
0.0000 [IU] | SUBCUTANEOUS | Status: DC
  Administered 2016-08-04: 3 [IU] via SUBCUTANEOUS
  Administered 2016-08-04: 2 [IU] via SUBCUTANEOUS
  Administered 2016-08-04: 3 [IU] via SUBCUTANEOUS
  Administered 2016-08-05: 2 [IU] via SUBCUTANEOUS
  Administered 2016-08-05: 3 [IU] via SUBCUTANEOUS
  Administered 2016-08-05 (×2): 2 [IU] via SUBCUTANEOUS
  Administered 2016-08-05 (×2): 3 [IU] via SUBCUTANEOUS
  Administered 2016-08-06: 2 [IU] via SUBCUTANEOUS
  Administered 2016-08-06: 3 [IU] via SUBCUTANEOUS
  Administered 2016-08-06: 5 [IU] via SUBCUTANEOUS
  Administered 2016-08-06: 2 [IU] via SUBCUTANEOUS

## 2016-08-04 MED ORDER — HEPARIN SOD (PORK) LOCK FLUSH 100 UNIT/ML IV SOLN
500.0000 [IU] | INTRAVENOUS | Status: DC
  Filled 2016-08-04: qty 5

## 2016-08-04 MED ORDER — INSULIN ASPART 100 UNIT/ML ~~LOC~~ SOLN: 1.0000 [IU] | SUBCUTANEOUS | Status: DC | PRN

## 2016-08-04 MED ORDER — HEPARIN SOD (PORK) LOCK FLUSH 100 UNIT/ML IV SOLN
500.0000 [IU] | INTRAVENOUS | Status: DC | PRN
  Administered 2016-08-04: 500 [IU]
  Filled 2016-08-04 (×2): qty 5

## 2016-08-04 MED ORDER — LEVOTHYROXINE SODIUM 100 MCG IV SOLR
157.0000 ug | Freq: Every day | INTRAVENOUS | Status: DC
  Administered 2016-08-05 – 2016-08-09 (×5): 157 ug via INTRAVENOUS
  Filled 2016-08-04 (×6): qty 10

## 2016-08-04 MED ORDER — HYDRALAZINE HCL 20 MG/ML IJ SOLN
10.0000 mg | INTRAMUSCULAR | Status: DC | PRN
Start: 2016-08-04 — End: 2016-08-10
  Administered 2016-08-05 – 2016-08-06 (×2): 20 mg via INTRAVENOUS
  Administered 2016-08-06: 40 mg via INTRAVENOUS
  Administered 2016-08-06: 20 mg via INTRAVENOUS
  Administered 2016-08-07 (×2): 40 mg via INTRAVENOUS
  Administered 2016-08-07 – 2016-08-08 (×3): 20 mg via INTRAVENOUS
  Administered 2016-08-08: 40 mg via INTRAVENOUS
  Filled 2016-08-04: qty 1
  Filled 2016-08-04: qty 2
  Filled 2016-08-04: qty 1
  Filled 2016-08-04 (×2): qty 2
  Filled 2016-08-04 (×2): qty 1
  Filled 2016-08-04: qty 2
  Filled 2016-08-04 (×2): qty 1

## 2016-08-04 MED ORDER — ACETAMINOPHEN 160 MG/5ML PO SOLN
650.0000 mg | ORAL | Status: DC | PRN
  Administered 2016-08-04 – 2016-08-05 (×2): 650 mg
  Filled 2016-08-04 (×3): qty 20.3

## 2016-08-04 MED ORDER — DOXYCYCLINE HYCLATE 100 MG IV SOLR
100.0000 mg | Freq: Two times a day (BID) | INTRAVENOUS | Status: DC
  Filled 2016-08-04: qty 100

## 2016-08-04 MED ORDER — SODIUM CHLORIDE 0.9% FLUSH: 10.0000 mL | INTRAVENOUS | Status: DC | PRN

## 2016-08-04 MED ORDER — VITAL HIGH PROTEIN PO LIQD: 1000.0000 mL | ORAL | Status: DC

## 2016-08-04 MED ORDER — POTASSIUM CHLORIDE 20 MEQ/15ML (10%) PO SOLN
20.0000 meq | Freq: Once | ORAL | Status: AC
  Administered 2016-08-04: 20 meq
  Filled 2016-08-04: qty 15

## 2016-08-04 MED ORDER — OSMOLITE 1.2 CAL PO LIQD
1000.0000 mL | ORAL | Status: DC
  Administered 2016-08-04: 1000 mL

## 2016-08-04 MED ORDER — IPRATROPIUM-ALBUTEROL 0.5-2.5 (3) MG/3ML IN SOLN
3.0000 mL | RESPIRATORY_TRACT | Status: DC
  Administered 2016-08-04 – 2016-08-09 (×31): 3 mL via RESPIRATORY_TRACT
  Filled 2016-08-04 (×31): qty 3

## 2016-08-04 MED ORDER — METOPROLOL TARTRATE 25 MG/10 ML ORAL SUSPENSION
50.0000 mg | Freq: Two times a day (BID) | ORAL | Status: DC
  Administered 2016-08-04 (×2): 50 mg
  Filled 2016-08-04 (×3): qty 20

## 2016-08-04 MED ORDER — VANCOMYCIN HCL IN DEXTROSE 750-5 MG/150ML-% IV SOLN
750.0000 mg | INTRAVENOUS | Status: DC
  Administered 2016-08-04: 750 mg via INTRAVENOUS
  Filled 2016-08-04: qty 150
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
## 2016-08-04 NOTE — Progress Notes (Signed)
PULMONARY / CRITICAL CARE MEDICINE   Name: [name] MRN: [id number] DOB: [birth year]    ADMISSION DATE:  08/03/2016  REFERRING MD:  Margarita Grizzleanielle Ray, M.D. / EDP [facility]  CHIEF COMPLAINT:  Acute Hypoxic Respiratory Failure with Pneumonia   SUBJECTIVE:  Sedated   VITAL SIGNS: BP (!) 165/118 (BP Location: Left Arm)   Pulse (!) 114   Temp 99.9 F (37.7 C) (Core (Comment))   Resp 19   Ht 5\' 2"  (1.575 m)   Wt 85 lb 1.6 oz (38.6 kg)   SpO2 92%   BMI 15.56 kg/m   HEMODYNAMICS:    VENTILATOR SETTINGS: Vent Mode: PRVC FiO2 (%):  [50 %-100 %] 50 % Set Rate:  [16 bmp] 16 bmp Vt Set:  [450 mL] 450 mL PEEP:  [5 cmH20] 5 cmH20 Plateau Pressure:  [14 cmH20-18 cmH20] 16 cmH20  INTAKE / OUTPUT: I/O last 3 completed shifts: In: 2640.9 [I.V.:425.9; IV Piggyback:2215] Out: 293 [Urine:193; Emesis/NG output:100]  PHYSICAL EXAMINATION: General:  Thin, cachectic female. No acute distress. Integument:  Warm & dry. No rash on exposed skin. No bruising. Lymphatics:  No appreciated cervical or supraclavicular lymphadenoapthy. HEENT:  No scleral injection or icterus. Endotracheal tube in place.  Cardiovascular:  Regular rhythm. No edema. No appreciable JVD.  Pulmonary:  Coarse breath sounds bilaterally. Symmetric chest wall rise on ventilator. Abdomen: Soft. Normal bowel sounds. Nondistended.  Musculoskeletal:  Normal bulk and tone. No joint deformity or effusion appreciated. Neurological:  Sedated.  Pupils symmetric. No spontaneous movements. Psychiatric:  Unable to assess given sedation and endotracheal intubation.  LABS:  BMET  Recent Labs Lab 08/03/16 1549 08/04/16 0656  NA 142 146*  K 2.4* 3.6  CL 100* 110  CO2 29 25  BUN 27* 32*  CREATININE 0.93 0.93  GLUCOSE 114* 146*    Electrolytes  Recent Labs Lab 08/03/16 1549 08/04/16 0119 08/04/16 0656  CALCIUM 8.6*  --  8.1*  MG  --  2.2  --   PHOS  --  4.3  --     CBC  Recent Labs Lab  08/03/16 1549 08/04/16 0119  WBC 9.0 6.9  HGB 13.5 12.1  HCT 40.3 36.1  PLT 202 187    Coag's No results for input(s): APTT, INR in the last 168 hours.  Sepsis Markers  Recent Labs Lab 08/03/16 1604 08/03/16 1844 08/03/16 2127 08/04/16 0119  LATICACIDVEN 2.49* 2.3* 1.5  --   PROCALCITON  --   --   --  4.94    ABG  Recent Labs Lab 08/03/16 1855  PHART 7.455*  PCO2ART 41.4  PO2ART 206.00*    Liver Enzymes  Recent Labs Lab 08/03/16 1549  AST 19  ALT 10*  ALKPHOS 57  BILITOT 1.7*  ALBUMIN 2.8*    Cardiac Enzymes  Recent Labs Lab 08/04/16 0119 08/04/16 0656  TROPONINI <0.03 <0.03    Glucose  Recent Labs Lab 08/04/16 0100 08/04/16 0317 08/04/16 0751  GLUCAP 145* 218* 133*    Imaging Dg Chest 1 View  Result Date: 08/03/2016 CLINICAL DATA:  Post intubation EXAM: CHEST 1 VIEW COMPARISON:  08/03/2016 FINDINGS: Endotracheal tube just below the carina in the right main bronchus. Recommend withdrawal 4 cm. Port-A-Cath tip in the SVC Chronic lung disease. Diffuse bilateral airspace disease which has progressed on the right. Probable pneumonia. No significant effusion. IMPRESSION: Endotracheal tube in right main bronchus.  Recommend withdrawal 4 cm Progression of infiltrate on the right most likely due to pneumonia. Diffuse bilateral airspace  disease. These results will be called to the ordering clinician or representative by the Radiologist Assistant, and communication documented in the PACS or zVision Dashboard. Electronically Signed   By: Marlan Palau M.D.   On: 08/03/2016 18:26   Portable Chest Xray  Result Date: 08/04/2016 CLINICAL DATA:  Endotracheal tube repositioning.  Initial encounter. EXAM: PORTABLE CHEST 1 VIEW COMPARISON:  Chest radiograph performed earlier today at 6:06 p.m. FINDINGS: The patient's endotracheal tube is seen ending 4-5 cm above the carina. An enteric tube is noted extending below the diaphragm. A right-sided chest port is  noted ending about the proximal to mid SVC. The lungs are well-aerated. Patchy bilateral airspace opacification remains concerning for pneumonia, similar in appearance to the prior study. No pleural effusion or pneumothorax is seen. The heart remains normal in size. No acute osseous abnormalities are identified. IMPRESSION: 1. Endotracheal tube seen ending 4-5 cm above the carina. 2. Patchy bilateral airspace opacification remains concerning for pneumonia, similar in appearance to the prior study. Electronically Signed   By: Roanna Raider M.D.   On: 08/04/2016 00:03   Dg Chest Port 1 View  Result Date: 08/03/2016 CLINICAL DATA:  Altered mental status.  Hypoxia. EXAM: PORTABLE CHEST 1 VIEW COMPARISON:  11/16/2012 FINDINGS: COPD with pulmonary hyperinflation. Diffuse bilateral airspace disease which may be due to pneumonia or edema. This was not present previously. No effusion. Heart size normal. Port-A-Cath tip in the proximal SVC unchanged. IMPRESSION: COPD with diffuse bilateral airspace disease. Diffuse pneumonia versus heart failure however the heart is not enlarged. Electronically Signed   By: Marlan Palau M.D.   On: 08/03/2016 16:29     STUDIES:  EKG 11/28:  Sinus Tachycardia. QTc . Port CXR 11/28: Personally reviewed by me. Hyperinflation. Increased interstitial markings bilaterally with patchy opacification in the right mid and lower lung zone. Right main stem intubation by endotracheal tube.  MICROBIOLOGY: Blood Ctx x2 11/28 >> no growth <24 h - pending Urine Ctx 11/28 >> Pending Influenza A PCR 11/28:  Negative  Resp Ctx 11/28 >> pending  ANTIBIOTICS: Vancomycin 11/28 >> Zosyn 11/28 >> Doxycycline 11/28 >>  SIGNIFICANT EVENTS: 11/28 - Presented to [facility] ED>>intubated for hypoxia/pneumonia>>transferred to [facility]  LINES/TUBES: OETT 7.0 11/28 >> OGT 11/28 >> FOLEY 11/28 >> R Chest Port >> PIV  DISCUSSION:  60 y.o. female presenting with acute encephalopathy and acute hypoxic  respiratory failure. Severe community acquired pneumonia with viral prodrome is most likely etiology as U strep positive. For today plan is to decrease sedating meds, start narrowing abx and cont supportive care. May be able to start weaning when MS allows.   ASSESSMENT / PLAN:  PULMONARY A: Acute Hypoxic Respiratory Failure - Likely due to pneumonia. H/O Tobacco Use Disorder  P:   Full Vent Support Checking Stat Portable CXR SBT & PS Weaning as mental status & clinical status improve  INFECTIOUS A:   Sepsis Severe Community Acquired Pneumonia -u-strep positive so likely pneumococcal  -influenza negative  P:   Dc doxy Cont vanc/zosyn--.narrow to mono coverage soon  Follow Tracheal Aspirate culture, Respiratory Viral Panel PCR, Urine Legionella Antigen Blood & Urine Cultures obtained at [facility] Trending Procalcitonin per Algorithm Droplet Isolation  NEUROLOGIC A:   Acute Encephalopathy - Likely multifactorial from toxic metabolic & hypoxia. Sedation on Ventilator Chronic Pain Syndrome  P:   RASS goal: 0 to -1 Wean Propofol gtt to off  Fentanyl IV prn Holding Fentanyl Duragesic Patch  CARDIOVASCULAR A:  H/O HTN H/O Non-sustained V tach  H/O NSTEMI Sepsis   P:  Continuous Telemetry Monitoring  Vitals per Unit Protocol Goal MAP >65 Add scheduled lopressor   RENAL A:   Hypokalemia - Replaced in the ED. H/O hypokalemia as well. Lactic Acidosis - Resolved.  P:   Trending UOP with Foley Monitoring electrolytes and renal function daily Replacing electrolytes as indicated  GASTROINTESTINAL A:   Diarrhea/Stool Incontinence - Likely due to encephalopathy. Severe Protein-Calorie Malnutrition H/O IBS  P:   NPO Protonix VT daily Begin Tube Feedings   HEMATOLOGIC A:   Thrombocytopenia - Mild.  P:  Trending cell counts daily w/ CBC SCDs Heparin [place] q8hr PTT & INR  ENDOCRINE A:   H/O Hypothyroidism Hyperglycemia - Mild. No h/o DM.  P:   f/u TSH & Free  T4 Continuing Synthroid 100mcg daily VT Checking Hgb A1c > pending Accu-Checks q4hr with MD notification parameters  SSI per protocol  FAMILY  - Updates:  Niece updated at bedside 11/28 by Dr. Jamison NeighborNestor.  - Inter-disciplinary family meet or Palliative Care meeting due by:  12/5  My critical care time  30 minutes Simonne MartinetPeter E Babcock ACNP-BC [facility] Pulmonary/Critical Care Pager # [phone] OR # [id number] if no answer

## 2016-08-04 NOTE — Progress Notes (Signed)
Initial Nutrition Assessment  DOCUMENTATION CODES:   Severe malnutrition in context of chronic illness, Underweight  INTERVENTION:  - Will order Osmolite 1.2 @ 15 mL/hr and increase by 10 mL every 12 hours to reach goal rate of Osmolite 1.5 @ 55 mL/hr. At goal rate, this regimen will provide 1584 kcal, 73 grams of protein, and 1082 mL free water.  - Monitor magnesium, potassium, and phosphorus daily for at least 3 days, MD to replete as needed, as pt is at risk for refeeding syndrome given severe malnutrition, unknown oral intake hx. - RD will follow-up 11/30.  NUTRITION DIAGNOSIS:   Malnutrition related to chronic illness as evidenced by severe depletion of muscle mass, severe depletion of body fat.   GOAL:   Patient will meet greater than or equal to 90% of their needs  MONITOR:   TF tolerance, Vent status, Weight trends, Labs, I & O's  REASON FOR ASSESSMENT:   Ventilator, Consult Enteral/tube feeding initiation and management  ASSESSMENT:   60 y.o. female currently intubated. History obtained from the patient's electronic medical record. Patient with known history of chronic pain on chronic pain medication as well as supraventricular tachycardia. Patient reportedly in her usual state of health last Friday when last seen. Patient currently living alone. At that time patient was complaining of feeling as though she had a "virus" with some coughing. Her friend had to have management (her door today when she arrived. Patient was found sitting on her couch covered in stool. Patient was responsive with only eye-opening to her name and was only able to follow some commands without giving any history in the emergency department. Patient required nonrebreather mask to improve her desaturation but with ongoing confusion and no close family or friends otherwise the decision was made to intubate the patient. Transfer was requested to our facility for ongoing treatment of her acute hypoxic  respiratory failure. The patient's niece showed up at the end of my exam reporting that the patient is very secretive about her medical health and history. Reportedly she has been undergoing some form of treatment [place] in [place] but we are unsure regarding what form of treatment or exactly where this is being undertaken. She reports she last saw her aunt normal just prior to Thanksgiving.  Pt seen for new vent. Talked with Cindee LamePete, CCM NP, and plan to start TF at this time. OGT in place with 300cc dark brown drainage present in canister; marking indicates that 50cc is from this shift. No family/visitors present at this time for PTA information. H&P indicates pt with hx of hypokalemia.  Physical assessment shows severe muscle and severe fat wasting to upper and lower body. No edema present at this time. Per chart review, weight has fluctuated over the past 4 months (76-88 lbs). Will use CBW of 38.6 kg to estimate nutrition needs.  Patient is currently intubated on ventilator support and was intubated yesterday at ~1740. MV: 10.6 L/min Temp (24hrs), Avg:100.9 F (38.3 C), Min:98.9 F (37.2 C), Max:102.4 F (39.1 C) Propofol: none   Medications reviewed; 100 mcg Synthroid per OGT/day, 125 mg IV Solu-medrol x1 dose yesterday, 40 mg Protonix per OGT/day, 30 mEq IV KCl x1 dose yesterday. Labs reviewed; CBGs: 133-218 mg/dL since midnight, Na: 578146 mmol/L, BUN: 32 mg/dL, Ca: 8.1 mg/dL.  IVF: LR @ 50 mL/hr.    Diet Order:  Diet NPO time specified  Skin:  Reviewed, no issues  Last BM:  PTA/unknown  Height:   Ht Readings from Last 1  Encounters:  08/03/16 5\' 2"  (1.575 m)    Weight:   Wt Readings from Last 1 Encounters:  08/04/16 85 lb 1.6 oz (38.6 kg)    Ideal Body Weight:  50 kg  BMI:  Body mass index is 15.56 kg/m.  Estimated Nutritional Needs:   Kcal:  1528  Protein:  58-77 grams (1.5-2 grams/kg)  Fluid:  1.5 L/day  EDUCATION NEEDS:   No education needs identified at  this time    Trenton GammonJessica Galya Dunnigan, MS, RD, LDN, CNSC Inpatient Clinical Dietitian Pager # [phone] After hours/weekend pager # [phone]

## 2016-08-04 NOTE — Care Management Note (Signed)
Case Management Note  Patient Details  Name: [name] MRN: [id number] Date of Birth: 17-Nov-1955  Subjective/Objective:   pna via cxr, resp failure-intubation required.                 Action/Plan: Date:  August 04, 2016 Chart reviewed for concurrent status and case management needs. Will continue to follow patient progress. Discharge Planning: following for needs Expected discharge date: [id number] [name], BSN, RufusRN3, [place]   [phone]  Expected Discharge Date:                  Expected Discharge Plan:  Home/Self Care  In-House Referral:     Discharge planning Services     Post Acute Care Choice:    Choice offered to:     DME Arranged:    DME Agency:     HH Arranged:    HH Agency:     Status of Service:  In process, will continue to follow  If discussed at Long Length of Stay Meetings, dates discussed:    Additional Comments:  [name], Olivia Buczynski Lynn, RN 08/04/2016, 9:40 AM

## 2016-08-04 NOTE — Progress Notes (Signed)
Pharmacy Antibiotic Note  Olivia StaggersMartha A XXXDixon is a 60 y.o. female presented to the ED on 11/28 with failure to thrive and SOB.  CXR showed patchy bilateral airspace opacification with concern for PNA.  Broad abx with vancomycin, zosyn and doxycycline were started on admission for PNA/sespsis.  Today, 08/04/2016: - day #1 abx - Tmax 102.4, wbc wnl - scr 0.93 (crcl~36 --low weight)   Plan: - continue Zosyn 3.375 gm IV q8h (infuse over 4 hrs) - adjust Vancomycin to 750 mg q24h - doxy 100mg  q12h per MD - f/u cultures, renal function ___________________________  Height: 5\' 2"  (157.5 cm) Weight: 85 lb 1.6 oz (38.6 kg) IBW/kg (Calculated) : 50.1  Temp (24hrs), Avg:100.9 F (38.3 C), Min:98.9 F (37.2 C), Max:102.4 F (39.1 C)   Recent Labs Lab 08/03/16 1549 08/03/16 1604 08/03/16 1844 08/03/16 2127 08/04/16 0119 08/04/16 0656  WBC 9.0  --   --   --  6.9  --   CREATININE 0.93  --   --   --   --  0.93  LATICACIDVEN  --  2.49* 2.3* 1.5  --   --     Estimated Creatinine Clearance (by C-G formula based on SCr of 0.93 mg/dL) Female: 30.839.7 mL/min Female: 46.7 mL/min    Allergies  Allergen Reactions  . Aspirin Rash  . Calcium-Containing Compounds Rash  . Codeine Rash  . Tagamet [Cimetidine] Rash    Antimicrobials this admission:  11/28 Vanc  >>  11/28 Zosyn >>  11/28 doxy>>  Dose adjustments this admission:  n/a  Microbiology results:   11/28 BCx x2:   11/28 UCx:  11/28 TA:  11/29 esp panel:  11/29 MRSA PCR: neg 11/29 ur legionella: 11/29 ur strep pneu (+)  Thank you for allowing pharmacy to be a part of this patient's care.  Lucia Gaskinsham, Joydan Gretzinger P 08/04/2016 9:45 AM

## 2016-08-05 ENCOUNTER — Inpatient Hospital Stay (HOSPITAL_COMMUNITY): Payer: Medicare Other

## 2016-08-05 DIAGNOSIS — J449 Chronic obstructive pulmonary disease, unspecified: Secondary | ICD-10-CM

## 2016-08-05 DIAGNOSIS — A403 Sepsis due to Streptococcus pneumoniae: Secondary | ICD-10-CM

## 2016-08-05 DIAGNOSIS — R64 Cachexia: Secondary | ICD-10-CM

## 2016-08-05 LAB — HEMOGLOBIN A1C
HEMOGLOBIN A1C: 5.6 % (ref 4.8–5.6)
MEAN PLASMA GLUCOSE: 114 mg/dL

## 2016-08-05 LAB — BASIC METABOLIC PANEL
ANION GAP: 11 (ref 5–15)
BUN: 38 mg/dL — ABNORMAL HIGH (ref 6–20)
CALCIUM: 8 mg/dL — AB (ref 8.9–10.3)
CHLORIDE: 108 mmol/L (ref 101–111)
CO2: 27 mmol/L (ref 22–32)
Creatinine, Ser: 1.07 mg/dL — ABNORMAL HIGH (ref 0.44–1.00)
GFR calc non Af Amer: 56 mL/min — ABNORMAL LOW (ref >60)
GLUCOSE: 149 mg/dL — AB (ref 65–99)
POTASSIUM: 3.3 mmol/L — AB (ref 3.5–5.1)
Sodium: 146 mmol/L — ABNORMAL HIGH (ref 135–145)

## 2016-08-05 LAB — URINE CULTURE: Culture: NO GROWTH

## 2016-08-05 LAB — GLUCOSE, CAPILLARY
GLUCOSE-CAPILLARY: 153 mg/dL — AB (ref 65–99)
Glucose-Capillary: 150 mg/dL — ABNORMAL HIGH (ref 65–99)
Glucose-Capillary: 155 mg/dL — ABNORMAL HIGH (ref 65–99)
Glucose-Capillary: 142 mg/dL — ABNORMAL HIGH (ref 65–99)
Glucose-Capillary: 161 mg/dL — ABNORMAL HIGH (ref 65–99)

## 2016-08-05 LAB — LEGIONELLA PNEUMOPHILA SEROGP 1 UR AG: L. pneumophila Serogp 1 Ur Ag: NEGATIVE

## 2016-08-05 LAB — CBC
HEMATOCRIT: 35.5 % — AB (ref 36.0–46.0)
HEMOGLOBIN: 11.5 g/dL — AB (ref 12.0–15.0)
MCH: 30.8 pg (ref 26.0–34.0)
MCHC: 32.4 g/dL (ref 30.0–36.0)
MCV: 95.2 fL (ref 78.0–100.0)
Platelets: 186 10*3/uL (ref 150–400)
RBC: 3.73 MIL/uL — AB (ref 3.87–5.11)
RDW: 14.8 % (ref 11.5–15.5)
WBC: 12.7 10*3/uL — AB (ref 4.0–10.5)

## 2016-08-05 LAB — PROCALCITONIN: PROCALCITONIN: 3.39 ng/mL

## 2016-08-05 LAB — MAGNESIUM: MAGNESIUM: 2.2 mg/dL (ref 1.7–2.4)

## 2016-08-05 LAB — HIV ANTIBODY (ROUTINE TESTING W REFLEX): HIV SCREEN 4TH GENERATION: NONREACTIVE

## 2016-08-05 LAB — APTT: aPTT: 27 seconds (ref 24–36)

## 2016-08-05 LAB — PHOSPHORUS: PHOSPHORUS: 3.6 mg/dL (ref 2.5–4.6)

## 2016-08-05 LAB — PROTIME-INR
INR: 1.06
Prothrombin Time: 13.8 seconds (ref 11.4–15.2)

## 2016-08-05 LAB — TROPONIN I: Troponin I: <0.03 ng/mL (ref <0.03)

## 2016-08-05 MED ORDER — DEXMEDETOMIDINE HCL IN NACL 200 MCG/50ML IV SOLN
0.0000 ug/kg/h | INTRAVENOUS | Status: DC
  Administered 2016-08-05: 0.4 ug/kg/h via INTRAVENOUS
  Administered 2016-08-06: 1 ug/kg/h via INTRAVENOUS
  Administered 2016-08-06: 0.6 ug/kg/h via INTRAVENOUS
  Filled 2016-08-05 (×4): qty 50

## 2016-08-05 MED ORDER — FREE WATER
200.0000 mL | Freq: Four times a day (QID) | Status: DC
  Administered 2016-08-05 – 2016-08-06 (×4): 200 mL

## 2016-08-05 MED ORDER — ENOXAPARIN SODIUM 40 MG/0.4ML ~~LOC~~ SOLN
40.0000 mg | Freq: Two times a day (BID) | SUBCUTANEOUS | Status: DC
  Administered 2016-08-05 – 2016-08-10 (×10): 40 mg via SUBCUTANEOUS
  Filled 2016-08-05 (×10): qty 0.4

## 2016-08-05 MED ORDER — POTASSIUM CHLORIDE 20 MEQ/15ML (10%) PO SOLN
40.0000 meq | ORAL | Status: AC
  Administered 2016-08-05 (×2): 40 meq
  Filled 2016-08-05 (×2): qty 30

## 2016-08-05 MED ORDER — OSMOLITE 1.2 CAL PO LIQD
1000.0000 mL | ORAL | Status: DC
  Administered 2016-08-05 (×2): 1000 mL

## 2016-08-05 MED ORDER — METOPROLOL TARTRATE 25 MG/10 ML ORAL SUSPENSION
100.0000 mg | Freq: Two times a day (BID) | ORAL | Status: DC
  Filled 2016-08-05 (×2): qty 40

## 2016-08-05 MED ORDER — DEXTROSE 5 % IV SOLN
2.0000 g | INTRAVENOUS | Status: DC
  Administered 2016-08-05: 2 g via INTRAVENOUS
  Filled 2016-08-05 (×2): qty 2

## 2016-08-05 MED ORDER — HEPARIN (PORCINE) IN NACL 100-0.45 UNIT/ML-% IJ SOLN
900.0000 [IU]/h | INTRAMUSCULAR | Status: DC
  Filled 2016-08-05: qty 250

## 2016-08-05 MED ORDER — FENTANYL CITRATE (PF) 100 MCG/2ML IJ SOLN
50.0000 ug | INTRAMUSCULAR | Status: DC | PRN
  Administered 2016-08-06: 50 ug via INTRAVENOUS
  Filled 2016-08-05 (×3): qty 2

## 2016-08-05 MED ORDER — POTASSIUM CHLORIDE 20 MEQ/15ML (10%) PO SOLN
40.0000 meq | Freq: Once | ORAL | Status: AC
  Administered 2016-08-05: 40 meq
  Filled 2016-08-05: qty 30

## 2016-08-05 MED ORDER — FENTANYL CITRATE (PF) 100 MCG/2ML IJ SOLN
50.0000 ug | INTRAMUSCULAR | Status: DC | PRN
  Administered 2016-08-05 – 2016-08-06 (×4): 50 ug via INTRAVENOUS
  Filled 2016-08-05 (×2): qty 2

## 2016-08-05 MED ORDER — SODIUM CHLORIDE 0.9 % IV BOLUS (SEPSIS)
1000.0000 mL | Freq: Once | INTRAVENOUS | Status: AC
  Administered 2016-08-05: 1000 mL via INTRAVENOUS

## 2016-08-05 MED ORDER — IOPAMIDOL (ISOVUE-370) INJECTION 76%
100.0000 mL | Freq: Once | INTRAVENOUS | Status: AC | PRN
  Administered 2016-08-05: 75 mL via INTRAVENOUS

## 2016-08-05 NOTE — Progress Notes (Signed)
eLink Physician-Brief Progress Note Patient Name: Olivia StaggersMartha A XXXDixon DOB: 07/16/1956 MRN: [id number]   Date of Service  08/05/2016  HPI/Events of Note  BP low.  eICU Interventions  Will give NS 1 liter fluid bolus.     Intervention Category Major Interventions: Other:  Jakye Mullens 08/05/2016, 7:04 PM

## 2016-08-05 NOTE — Progress Notes (Addendum)
ANTICOAGULATION CONSULT NOTE - Initial Consult  Pharmacy Consult for Heparin Indication: pulmonary embolus  Allergies  Allergen Reactions  . Aspirin Rash  . Calcium-Containing Compounds Rash  . Codeine Rash  . Tagamet [Cimetidine] Rash    Patient Measurements: Height: 5\' 2"  (157.5 cm) Weight: 89 lb 4.6 oz (40.5 kg) IBW/kg (Calculated) : 50.1 Heparin Dosing Weight: 40.5 kg  Vital Signs: Temp: 99.7 F (37.6 C) (11/30 1200) BP: 112/78 (11/30 1600)  Labs:  Recent Labs  08/03/16 1549 08/04/16 0119 08/04/16 0656 08/05/16 0322 08/05/16 1042  HGB 13.5 12.1  --  11.5*  --   HCT 40.3 36.1  --  35.5*  --   PLT 202 187  --  186  --   APTT  --   --   --   --  27  LABPROT  --   --   --  13.8  --   INR  --   --   --  1.06  --   CREATININE 0.93  --  0.93 1.07*  --   TROPONINI  --  <0.03 <0.03 <0.03  --     Estimated Creatinine Clearance (by C-G formula based on SCr of 1.07 mg/dL (H)) Female: 08/07/16 mL/min Female: 42.6 mL/min   Medical History: Past Medical History:  Diagnosis Date  . Arthritis   . Bladder wall thickening 11/15/2012  . Chronic pain syndrome   . Enteritis 11/15/2012  . Hypertension   . Hypokalemia 11/15/2012  . Hypothyroidism   . IBS (irritable bowel syndrome) 11/19/2012  . Non-ST elevation MI (NSTEMI) (HCC) 11/16/2012   Preserved LV function; septal hypokinesis per echo.  . Non-sustained ventricular tachycardia (HCC) 11/16/2012    Medications:  Scheduled:  . cefTRIAXone (ROCEPHIN)  IV  2 g Intravenous Q24H  . chlorhexidine gluconate (MEDLINE KIT)  15 mL Mouth Rinse BID  . clopidogrel  75 mg Per Tube Daily  . feeding supplement (OSMOLITE 1.2 CAL)  1,000 mL Per Tube Q24H  . free water  200 mL Per Tube Q6H  . insulin aspart  0-15 Units Subcutaneous Q4H  . ipratropium-albuterol  3 mL Nebulization Q4H  . levothyroxine  157 mcg Intravenous Daily  . mouth rinse  15 mL Mouth Rinse QID  . metoprolol tartrate  100 mg Per Tube BID  . pantoprazole sodium  40 mg  Per Tube Daily   Infusions:  . dexmedetomidine 0.5 mcg/kg/hr (08/05/16 1500)  . heparin    . lactated ringers 50 mL/hr at 08/05/16 1500   PRN: sodium chloride, acetaminophen (TYLENOL) oral liquid 160 mg/5 mL, fentaNYL (SUBLIMAZE) injection, fentaNYL (SUBLIMAZE) injection, [DISCONTINUED] heparin lock flush **AND** heparin lock flush, hydrALAZINE, insulin aspart, sodium chloride flush  Assessment: 60yo female with PMHx chronic pain, SVTs, and IBS found covered in stool at home, minimally responsive.  Per family, very secretive regarding health history.  Pharmacy dosing abx for severe CAP with viral prodrome.  Pt required intubation and tx to ICU.   11/30 CT angio (+) for PE.  Pharmacy consulted to begin IV heparin.   Goal of Therapy:  Heparin level 0.3-0.7 units/ml Monitor platelets by anticoagulation protocol: Yes   Plan:   No heparin bolus since received SQ heparin at 13:00 today  IV heparin infusion 900 units/hr  Check heparin level 8hr after starting drip  Daily heparin level and CBC  12/30, PharmD, BCPS Pager: [phone] 08/05/2016,5:26 PM  Addendum: Difficulty with IV access, therefore, changing IV heparin to Lovenox.  Begin Lovenox 1mg /kg SQ q12h.

## 2016-08-05 NOTE — Progress Notes (Signed)
eLink Physician-Brief Progress Note Patient Name: Olivia StaggersMartha A XXXDixon DOB: 1956/04/08 MRN: [id number]   Date of Service  08/05/2016  HPI/Events of Note  Difficulty with IV access.  eICU Interventions  Will change heparin gtt to lovenox     Intervention Category Major Interventions: Other:  Keya Wynes 08/05/2016, 6:08 PM

## 2016-08-05 NOTE — Progress Notes (Addendum)
Nutrition Follow-up  DOCUMENTATION CODES:   Severe malnutrition in context of chronic illness, Underweight  INTERVENTION:  - Increase Osmolite 1.2 to 35 mL/hr at this time and to goal rate of 45 mL/hr in 12 hours. At goal rate of Osmolite 1.2 @ 45 TF regimen + kcal from Propofol will provide 1571 kcal, 60 grams of protein, and 886 mL free water.  - Monitor magnesium, potassium, and phosphorus daily for at least 3 days, MD to replete as needed, as pt is at risk for refeeding syndrome given severe malnutrition, unknown oral intake hx. - RD will follow-up 12/1.  NUTRITION DIAGNOSIS:   Malnutrition related to chronic illness as evidenced by severe depletion of muscle mass, severe depletion of body fat. -ongoing  GOAL:   Patient will meet greater than or equal to 90% of their needs -unmet with TF slowly advancing.  MONITOR:   TF tolerance, Vent status, Weight trends, Labs, I & O's  ASSESSMENT:   60 y.o. female currently intubated. History obtained from the patient's electronic medical record. Patient with known history of chronic pain on chronic pain medication as well as supraventricular tachycardia. Patient reportedly in her usual state of health last Friday when last seen. Patient currently living alone. At that time patient was complaining of feeling as though she had a "virus" with some coughing. Her friend had to have management (her door today when she arrived. Patient was found sitting on her couch covered in stool. Patient was responsive with only eye-opening to her name and was only able to follow some commands without giving any history in the emergency department. Patient required nonrebreather mask to improve her desaturation but with ongoing confusion and no close family or friends otherwise the decision was made to intubate the patient. Transfer was requested to our facility for ongoing treatment of her acute hypoxic respiratory failure. The patient's niece showed up at the end of  my exam reporting that the patient is very secretive about her medical health and history. Reportedly she has been undergoing some form of treatment here in [place] but we are unsure regarding what form of treatment or exactly where this is being undertaken. She reports she last saw her aunt normal just prior to Thanksgiving.  11/30 Pt with OGT in place and currently receiving Osmolite 1.2 @ 25 mL/hr which is providing 720 kcal (total of 995 kcal with kcal from Propofol), 33 grams of protein, and 492 mL free water. Order in place per MD for 200 mL free water TID (600 mL/day). Continue to use weight from yesterday (38.6 kg) to estimate nutrition needs as weight +1.9 kg since that time. No family/visitors present this AM.   Communicated TF rate advancement plan with RN. She reports that it has been found that pt has Crohn's disease and that d/t this she does not absorb some medications including oral Synthroid (she goes for weekly Synthroid injections on an outpatient basis). Will monitor TF closely in light of this and make adjustments as needed.   Patient is currently intubated on ventilator support MV: 11.5 L/min Temp (24hrs), Avg:100.9 F (38.3 C), Min:100 F (37.8 C), Max:102 F (38.9 C) Propofol: 10.4 ml/hr (275 kcal) BP: 116/60 and MAP: 78  Medications reviewed; sliding scale Novolog, PRN Novolog (1-4 units), 157 mcg IV Synthroid/day, 40 mg Protonix per OGT/day, 20 mEq KCl per OGT x1 dose yesterday.  Labs reviewed; CBGs: 150 and 153 mg/dL this AM, Na: 161146 mmol/L, K: 3.3 mmol/L, BUN: 38 mg/dL, creatinine: 0.961.07 mg/dL, Ca:  8 mg/dL.   IVF: LR @ 50 mL/hr.  Drip: Propofol @ 50 mcg/kg/min.   11/29 - Talked with Cindee LamePete, CCM NP, and plan to start TF at this time.  - OGT in place with 300cc dark brown drainage present in canister; marking indicates that 50cc is from this shift.  - No family/visitors present at this time for PTA information.  - H&P indicates pt with hx of hypokalemia. -  Physical assessment shows severe muscle and severe fat wasting to upper and lower body.  - No edema present at this time.  - Per chart review, weight has fluctuated over the past 4 months (76-88 lbs).  - Will use CBW of 38.6 kg to estimate nutrition needs.  Patient is currently intubated on ventilator support and was intubated yesterday at ~1740. MV: 10.6 L/min Temp (24hrs), Avg:100.9 F (38.3 C), Min:98.9 F (37.2 C), Max:102.4 F (39.1 C) Propofol: none IVF: LR @ 50 mL/hr.   Diet Order:  Diet NPO time specified  Skin:  Reviewed, no issues  Last BM:  11/28  Height:   Ht Readings from Last 1 Encounters:  08/03/16 5\' 2"  (1.575 m)    Weight:   Wt Readings from Last 1 Encounters:  08/05/16 89 lb 4.6 oz (40.5 kg)    Ideal Body Weight:  50 kg  BMI:  Body mass index is 16.33 kg/m.  Estimated Nutritional Needs:   Kcal:  1523  Protein:  58-77 grams (1.5-2 grams/kg)  Fluid:  1.5 L/day  EDUCATION NEEDS:   No education needs identified at this time    Trenton GammonJessica Jameson Morrow, MS, RD, LDN, CNSC Inpatient Clinical Dietitian Pager # [phone] After hours/weekend pager # [phone]

## 2016-08-05 NOTE — Progress Notes (Signed)
   08/05/16 1900  Vitals  BP (!) 79/63  MAP (mmHg) 67  ECG Heart Rate 86   Dr. Craige CottaSood made aware of blood pressure. Order given for 1L NS bolus.

## 2016-08-05 NOTE — Progress Notes (Signed)
eLink Physician-Brief Progress Note Patient Name: Olivia StaggersMartha A XXXDixon DOB: [birth year] MRN: [id number]   Date of Service  08/05/2016  HPI/Events of Note  CT angio chest with PE.  eICU Interventions  Add heparin gtt.     Intervention Category Major Interventions: Other:  Jettson Crable 08/05/2016, 5:11 PM

## 2016-08-05 NOTE — Progress Notes (Signed)
PULMONARY / CRITICAL CARE MEDICINE   Name: [name] MRN: [id number] DOB: [birth year]    ADMISSION DATE:  08/03/2016  REFERRING MD:  Margarita Grizzleanielle Ray, M.D. / EDP [facility]  CHIEF COMPLAINT:  Acute Hypoxic Respiratory Failure with Pneumonia   SUBJECTIVE:  Sedated, ETT in place   VITAL SIGNS: BP 105/61   Pulse 96   Temp (!) 102 F (38.9 C)   Resp (!) 29   Ht 5\' 2"  (1.575 m)   Wt 89 lb 4.6 oz (40.5 kg)   SpO2 90%   BMI 16.33 kg/m   HEMODYNAMICS:    VENTILATOR SETTINGS: Vent Mode: PRVC FiO2 (%):  [50 %-60 %] 60 % Set Rate:  [16 bmp] 16 bmp Vt Set:  [450 mL] 450 mL PEEP:  [5 cmH20] 5 cmH20 Plateau Pressure:  [13 cmH20-23 cmH20] 23 cmH20  INTAKE / OUTPUT: I/O last 3 completed shifts: In: 4490.2 [I.V.:1737.1; NG/GT:288.1; IV Piggyback:2465] Out: 1140 [Urine:935; Emesis/NG output:205]  PHYSICAL EXAMINATION: General:  Thin, cachectic female. No acute distress. Integument:  Warm & dry. No rash on exposed skin. No bruising. Lymphatics:  No appreciated cervical or supraclavicular lymphadenoapthy. HEENT:  No scleral injection or icterus. Endotracheal tube in place.  Cardiovascular:  Regular rhythm. No edema. No appreciable JVD.  Pulmonary:  Coarse breath sounds bilaterally. Symmetric chest wall rise on ventilator. Abdomen: Soft. Normal bowel sounds. Nondistended.  Musculoskeletal:  Emaciated. No joint deformity or effusion appreciated. Neurological:  Sedated.  Pupils symmetric. No spontaneous movements. Does not follow commands. Psychiatric:  Unable to assess given sedation and endotracheal intubation.  LABS:  BMET  Recent Labs Lab 08/03/16 1549 08/04/16 0656 08/05/16 0322  NA 142 146* 146*  K 2.4* 3.6 3.3*  CL 100* 110 108  CO2 29 25 27   BUN 27* 32* 38*  CREATININE 0.93 0.93 1.07*  GLUCOSE 114* 146* 149*    Electrolytes  Recent Labs Lab 08/03/16 1549 08/04/16 0119 08/04/16 0656 08/05/16 0322  CALCIUM 8.6*  --  8.1* 8.0*  MG  --  2.2   --  2.2  PHOS  --  4.3  --  3.6    CBC  Recent Labs Lab 08/03/16 1549 08/04/16 0119 08/05/16 0322  WBC 9.0 6.9 12.7*  HGB 13.5 12.1 11.5*  HCT 40.3 36.1 35.5*  PLT 202 187 186    Coag's  Recent Labs Lab 08/05/16 0322  INR 1.06    Sepsis Markers  Recent Labs Lab 08/03/16 1604 08/03/16 1844 08/03/16 2127 08/04/16 0119 08/05/16 0322  LATICACIDVEN 2.49* 2.3* 1.5  --   --   PROCALCITON  --   --   --  4.94 3.39    ABG  Recent Labs Lab 08/03/16 1855  PHART 7.455*  PCO2ART 41.4  PO2ART 206.00*    Liver Enzymes  Recent Labs Lab 08/03/16 1549  AST 19  ALT 10*  ALKPHOS 57  BILITOT 1.7*  ALBUMIN 2.8*    Cardiac Enzymes  Recent Labs Lab 08/04/16 0119 08/04/16 0656 08/05/16 0322  TROPONINI <0.03 <0.03 <0.03    Glucose  Recent Labs Lab 08/04/16 0751 08/04/16 1220 08/04/16 1702 08/04/16 2053 08/04/16 2358 08/05/16 0322  GLUCAP 133* 130* 186* 157* 121* 150*    Imaging Dg Chest Port 1 View  Result Date: 08/05/2016 CLINICAL DATA:  Pneumonia.  Endotracheal tube position EXAM: PORTABLE CHEST 1 VIEW COMPARISON:  08/03/2016 FINDINGS: Endotracheal tube in satisfactory position. Port-A-Cath in the right innominate vein unchanged. Gastric tube enters the stomach. COPD with hyperinflation. Bibasilar airspace  disease has improved in the interval. Negative for heart failure or effusion IMPRESSION: Endotracheal tube remains in satisfactory position COPD with improved bibasilar airspace disease. Electronically Signed   By: Marlan Palauharles  Clark M.D.   On: 08/05/2016 07:04  cxr improved    STUDIES:  EKG 11/28:  Sinus Tachycardia. QTc 458ms. Port CXR 11/28: Personally reviewed by me. Hyperinflation. Increased interstitial markings bilaterally with patchy opacification in the right mid and lower lung zone. Right main stem intubation by endotracheal tube.  MICROBIOLOGY: Blood Ctx x2 11/28 >> pending Urine Ctx 11/28 >> negative Influenza A PCR 11/28:  Negative   Resp Ctx 11/28 >> rare GPC pairs>>> resp viral panel: neg  ANTIBIOTICS: Vancomycin 11/28 >> Zosyn 11/28 >> Doxycycline 11/28 >> 11/29  SIGNIFICANT EVENTS: 11/28 - Presented to [facility] ED>>intubated for hypoxia/pneumonia>>[facility]30 - Spike fever 38.4 over night  LINES/TUBES: OETT 7.0 11/28 >> OGT 11/28 >> FOLEY 11/28 >> R Chest Port >> PIV  DISCUSSION:   60 y.o. female presenting with acute encephalopathy and acute hypoxic respiratory failure. Severe community acquired pneumonia with viral prodrome is most likely etiology as U strep positive. For today plan is to transition to precedex and try to initiate weaning.   ASSESSMENT / PLAN:  PULMONARY A: Acute Hypoxic Respiratory Failure - Likely due to pneumonia. H/O Tobacco Use Disorder cxr improving  Barrier to weaning seems to be agitation and delirium  P:   Full Vent Support Checking Stat Portable CXR Change to precedex  SBT & PS Weaning as mental status & clinical status improve  INFECTIOUS A:   Sepsis Severe Community Acquired Pneumonia -u-strep positive so likely pneumococcal  -influenza negative as well as RVP - spiking fevers 11/30 early am but cxr, and PCT reassuring.  P:   Cont vanc/zosyn--.narrow to mono coverage soon  Trending Procalcitonin per Algorithm  NEUROLOGIC A:   Acute Encephalopathy - Likely multifactorial from toxic metabolic & hypoxia. Sedation on Ventilator Chronic Pain Syndrome  P:   RASS goal: 0 to -1 Fentanyl IV prn Holding Fentanyl Duragesic Patch Change to precedex  CARDIOVASCULAR A:  H/O HTN H/O Non-sustained V tach H/O NSTEMI Sepsis   P:  Continuous Telemetry Monitoring  Vitals per Unit Protocol Goal MAP >65  cont lopressor   RENAL A:   Hypokalemia Lactic Acidosis - Resolved. Hypernatremia  P:   Trending UOP with Foley Monitoring electrolytes and renal function daily Replacing electrolytes as indicated Free water replacement   GASTROINTESTINAL A:    Diarrhea/Stool Incontinence - Likely due to encephalopathy. Severe Protein-Calorie Malnutrition H/O IBS  P:   NPO Protonix VT daily Begin Tube Feedings   HEMATOLOGIC A:   Thrombocytopenia - Mild.  P:  Trending cell counts daily w/ CBC SCDs Heparin [place] q8hr PTT & INR  ENDOCRINE A:   H/O Hypothyroidism Hyperglycemia - Mild. No h/o DM.  P:   Continuing Synthroid  IV d/t prior issues w/ po absorption  Checking Hgb A1c > pending Accu-Checks q4hr with MD notification parameters  SSI per protocol  FAMILY  - Updates:  Niece updated at bedside 11/28 by Dr. Jamison NeighborNestor.  - Inter-disciplinary family meet or Palliative Care meeting due by:  12/5  My critical care time  30 minutes Simonne MartinetPeter E Babcock ACNP-BC [facility] Pulmonary/Critical Care Pager # [phone] OR # [phone] if no answer

## 2016-08-06 ENCOUNTER — Other Ambulatory Visit: Payer: Self-pay

## 2016-08-06 ENCOUNTER — Inpatient Hospital Stay (HOSPITAL_COMMUNITY): Payer: Medicare Other

## 2016-08-06 DIAGNOSIS — R609 Edema, unspecified: Secondary | ICD-10-CM

## 2016-08-06 DIAGNOSIS — I2699 Other pulmonary embolism without acute cor pulmonale: Secondary | ICD-10-CM

## 2016-08-06 LAB — CULTURE, RESPIRATORY W GRAM STAIN
Culture: NORMAL
Special Requests: NORMAL

## 2016-08-06 LAB — GLUCOSE, CAPILLARY
GLUCOSE-CAPILLARY: 159 mg/dL — AB (ref 65–99)
GLUCOSE-CAPILLARY: 144 mg/dL — AB (ref 65–99)
GLUCOSE-CAPILLARY: 112 mg/dL — AB (ref 65–99)
GLUCOSE-CAPILLARY: 101 mg/dL — AB (ref 65–99)
GLUCOSE-CAPILLARY: 108 mg/dL — AB (ref 65–99)
Glucose-Capillary: 224 mg/dL — ABNORMAL HIGH (ref 65–99)
Glucose-Capillary: 139 mg/dL — ABNORMAL HIGH (ref 65–99)

## 2016-08-06 LAB — BASIC METABOLIC PANEL
ANION GAP: 7 (ref 5–15)
BUN: 22 mg/dL — ABNORMAL HIGH (ref 6–20)
CALCIUM: 7.4 mg/dL — AB (ref 8.9–10.3)
CO2: 25 mmol/L (ref 22–32)
Chloride: 117 mmol/L — ABNORMAL HIGH (ref 101–111)
Creatinine, Ser: 0.67 mg/dL (ref 0.44–1.00)
Glucose, Bld: 167 mg/dL — ABNORMAL HIGH (ref 65–99)
Potassium: 4.1 mmol/L (ref 3.5–5.1)
Sodium: 149 mmol/L — ABNORMAL HIGH (ref 135–145)

## 2016-08-06 LAB — CBC
HCT: 31.6 % — ABNORMAL LOW (ref 36.0–46.0)
HEMOGLOBIN: 10.3 g/dL — AB (ref 12.0–15.0)
MCH: 31.9 pg (ref 26.0–34.0)
MCHC: 32.6 g/dL (ref 30.0–36.0)
MCV: 97.8 fL (ref 78.0–100.0)
Platelets: 145 10*3/uL — ABNORMAL LOW (ref 150–400)
RBC: 3.23 MIL/uL — AB (ref 3.87–5.11)
RDW: 15.1 % (ref 11.5–15.5)
WBC: 8.4 10*3/uL (ref 4.0–10.5)

## 2016-08-06 LAB — TRIGLYCERIDES: TRIGLYCERIDES: 112 mg/dL (ref <150)

## 2016-08-06 LAB — PHOSPHORUS: PHOSPHORUS: 2.6 mg/dL (ref 2.5–4.6)

## 2016-08-06 LAB — MAGNESIUM: MAGNESIUM: 1.9 mg/dL (ref 1.7–2.4)

## 2016-08-06 LAB — CULTURE, RESPIRATORY

## 2016-08-06 MED ORDER — METOPROLOL TARTRATE 25 MG/10 ML ORAL SUSPENSION
50.0000 mg | Freq: Two times a day (BID) | ORAL | Status: DC
  Administered 2016-08-06: 50 mg
  Filled 2016-08-06: qty 20

## 2016-08-06 MED ORDER — ACETAMINOPHEN 160 MG/5ML PO SOLN
650.0000 mg | ORAL | Status: DC | PRN
  Administered 2016-08-06: 650 mg via ORAL

## 2016-08-06 MED ORDER — FUROSEMIDE 10 MG/ML IJ SOLN: 40.0000 mg | Freq: Once | INTRAMUSCULAR | Status: DC

## 2016-08-06 MED ORDER — METOPROLOL TARTRATE 25 MG/10 ML ORAL SUSPENSION
50.0000 mg | Freq: Two times a day (BID) | ORAL | Status: DC
  Administered 2016-08-06 – 2016-08-09 (×6): 50 mg via ORAL
  Filled 2016-08-06 (×10): qty 20

## 2016-08-06 MED ORDER — DEXTROSE 5 % IV SOLN
1.0000 g | INTRAVENOUS | Status: DC
  Administered 2016-08-06 – 2016-08-09 (×4): 1 g via INTRAVENOUS
  Filled 2016-08-06 (×7): qty 10

## 2016-08-06 MED ORDER — DEXTROSE 5 % IV SOLN
INTRAVENOUS | Status: DC
  Administered 2016-08-06: 11:00:00 via INTRAVENOUS
  Administered 2016-08-07: 1000 mL via INTRAVENOUS

## 2016-08-06 MED ORDER — CLOPIDOGREL BISULFATE 75 MG PO TABS
75.0000 mg | ORAL_TABLET | Freq: Every day | ORAL | Status: DC
  Administered 2016-08-07 – 2016-08-09 (×3): 75 mg via ORAL
  Filled 2016-08-06 (×5): qty 1

## 2016-08-06 MED ORDER — FREE WATER: 250.0000 mL | Freq: Four times a day (QID) | Status: DC

## 2016-08-06 MED ORDER — DIAZEPAM 2 MG PO TABS: 2.0000 mg | ORAL_TABLET | Freq: Three times a day (TID) | ORAL | Status: DC | PRN

## 2016-08-06 MED ORDER — PANTOPRAZOLE SODIUM 40 MG PO TBEC
40.0000 mg | DELAYED_RELEASE_TABLET | Freq: Every day | ORAL | Status: DC
  Administered 2016-08-06 – 2016-08-09 (×4): 40 mg via ORAL
  Filled 2016-08-06 (×5): qty 1

## 2016-08-06 MED ORDER — FENTANYL CITRATE (PF) 100 MCG/2ML IJ SOLN: 12.5000 ug | INTRAMUSCULAR | Status: DC | PRN

## 2016-08-06 NOTE — Progress Notes (Addendum)
PULMONARY / CRITICAL CARE MEDICINE   Name: [name] MRN: [id number] DOB: [birth year]    ADMISSION DATE:  08/03/2016  REFERRING MD:  Margarita Grizzleanielle Ray, M.D. / EDP [facility]  CHIEF COMPLAINT:  Acute Hypoxic Respiratory Failure with Pneumonia   SUBJECTIVE:  Sedated, ETT in place   VITAL SIGNS: BP 137/68   Pulse 97   Temp 99.9 F (37.7 C) (Core (Comment))   Resp (!) 26   Ht 5\' 2"  (1.575 m)   Wt 95 lb 7.4 oz (43.3 kg)   SpO2 94%   BMI 17.46 kg/m   HEMODYNAMICS:    VENTILATOR SETTINGS: Vent Mode: PRVC FiO2 (%):  [40 %-60 %] 40 % Set Rate:  [16 bmp] 16 bmp Vt Set:  [450 mL] 450 mL PEEP:  [5 cmH20] 5 cmH20 Plateau Pressure:  [12 cmH20-17 cmH20] 13 cmH20  INTAKE / OUTPUT: I/O last 3 completed shifts: In: 3926.1 [I.V.:1822.9; NG/GT:1053.2; IV Piggyback:1050] Out: 1550 [Urine:1550]  PHYSICAL EXAMINATION: General:  Thin, cachectic female. No acute distress. Passing SBT Integument:  Warm & dry. No rash on exposed skin. No bruising. Lymphatics:  No appreciated cervical or supraclavicular lymphadenoapthy. HEENT:  No scleral injection or icterus. Endotracheal tube in place.  Cardiovascular:  Regular rhythm. No edema. No appreciable JVD.  Pulmonary: Clear, Symmetric chest wall rise on ventilator. Some accessory use but acceptable on SBT Abdomen: Soft. Normal bowel sounds. Nondistended.  Musculoskeletal:  Emaciated. No joint deformity or effusion appreciated. Neurological:Awake, moves all ext. Nods appropriately  Psychiatric:  Unable to assess given sedation and endotracheal intubation.  LABS:  BMET  Recent Labs Lab 08/04/16 0656 08/05/16 0322 08/06/16 0325  NA 146* 146* 149*  K 3.6 3.3* 4.1  CL 110 108 117*  CO2 25 27 25   BUN 32* 38* 22*  CREATININE 0.93 1.07* 0.67  GLUCOSE 146* 149* 167*    Electrolytes  Recent Labs Lab 08/04/16 0119 08/04/16 0656 08/05/16 0322 08/06/16 0325  CALCIUM  --  8.1* 8.0* 7.4*  MG 2.2  --  2.2 1.9  PHOS 4.3  --   3.6 2.6    CBC  Recent Labs Lab 08/04/16 0119 08/05/16 0322 08/06/16 0325  WBC 6.9 12.7* 8.4  HGB 12.1 11.5* 10.3*  HCT 36.1 35.5* 31.6*  PLT 187 186 145*    Coag's  Recent Labs Lab 08/05/16 0322 08/05/16 1042  APTT  --  27  INR 1.06  --     Sepsis Markers  Recent Labs Lab 08/03/16 1604 08/03/16 1844 08/03/16 2127 08/04/16 0119 08/05/16 0322  LATICACIDVEN 2.49* 2.3* 1.5  --   --   PROCALCITON  --   --   --  4.94 3.39    ABG  Recent Labs Lab 08/03/16 1855  PHART 7.455*  PCO2ART 41.4  PO2ART 206.00*    Liver Enzymes  Recent Labs Lab 08/03/16 1549  AST 19  ALT 10*  ALKPHOS 57  BILITOT 1.7*  ALBUMIN 2.8*    Cardiac Enzymes  Recent Labs Lab 08/04/16 0119 08/04/16 0656 08/05/16 0322  TROPONINI <0.03 <0.03 <0.03    Glucose  Recent Labs Lab 08/05/16 1128 08/05/16 1656 08/05/16 1940 08/05/16 2324 08/06/16 0309 08/06/16 0813  GLUCAP 155* 142* 161* 159* 144* 224*    Imaging Ct Angio Chest Pe W Or Wo Contrast  Result Date: 08/05/2016 CLINICAL DATA:  60 year old female with history of acute hypoxic respiratory failure and acute encephalopathy. Community acquired pneumonia. EXAM: CT ANGIOGRAPHY CHEST WITH CONTRAST TECHNIQUE: Multidetector CT imaging of the chest  was performed using the standard protocol during bolus administration of intravenous contrast. Multiplanar CT image reconstructions and MIPs were obtained to evaluate the vascular anatomy. CONTRAST:  75 mL of Isovue 370. COMPARISON:  No priors. FINDINGS: Cardiovascular: In a segmental sized branch of the left upper lobe extending into subsegmental sized branches there is a filling defect which appears nearly completely occlusive, compatible with pulmonary embolism. No other larger central or lobar sized filling defects are noted. Heart size is normal. Small amount of pericardial fluid and/or thickening, unlikely to be of any hemodynamic significance at this time. No associated  pericardial calcification. There is aortic atherosclerosis, as well as atherosclerosis of the great vessels of the mediastinum and the coronary arteries, including calcified atherosclerotic plaque in the left anterior descending coronary artery. Right subclavian single-lumen porta cath with tip terminating in the proximal superior vena cava. Mediastinum/Nodes: Multiple borderline enlarged mediastinal and hilar lymph nodes are noted. No pathologically enlarged mediastinal or hilar lymph nodes are noted. Esophagus is unremarkable in appearance. Nasogastric tube extends into the stomach. No axillary lymphadenopathy. Lungs/Pleura: There is widespread bronchial wall thickening, patchy areas of thickening of the peribronchovascular interstitium, patchy areas of mild cylindrical bronchiectasis, and extensive peribronchovascular airspace consolidation, compatible with multilobar bronchopneumonia. Involvement is most severe in the lower lobes of the lungs bilaterally, but all lobes are involved. Moderate centrilobular and paraseptal emphysema. No pleural effusions. Upper Abdomen: Nasogastric tube terminating in the stomach. Aortic atherosclerosis. Status post cholecystectomy. Musculoskeletal: There are no aggressive appearing lytic or blastic lesions noted in the visualized portions of the skeleton. Review of the MIP images confirms the above findings. IMPRESSION: 1. Study is positive for segmental sized pulmonary embolus to the left upper lobe with some extension into small subsegmental sized vessels. This appears nearly completely occlusive. 2. Severe multilobar bronchopneumonia, as detailed above. 3. Aortic atherosclerosis, in addition to left anterior descending coronary artery disease. Please note that although the presence of coronary artery calcium documents the presence of coronary artery disease, the severity of this disease and any potential stenosis cannot be assessed on this non-gated CT examination. Assessment  for potential risk factor modification, dietary therapy or pharmacologic therapy may be warranted, if clinically indicated. 4. Additional incidental findings, as above. These results will be called to the ordering clinician or representative by the Radiologist Assistant, and communication documented in the PACS or zVision Dashboard. Electronically Signed   By: Trudie Reed M.D.   On: 08/05/2016 16:30   Dg Chest Port 1 View  Result Date: 08/06/2016 CLINICAL DATA:  Pneumonia. EXAM: PORTABLE CHEST 1 VIEW COMPARISON:  Of an 30 17. FINDINGS: Support tubes and lines appear stable.  No pneumothorax. Patchy pulmonary opacities better visualized on CT, stable. Marked hyperinflation consistent with COPD. IMPRESSION: Stable chest. Electronically Signed   By: Elsie Stain M.D.   On: 08/06/2016 06:44  cxr w/out sig change    STUDIES:  EKG 11/28:  Sinus Tachycardia. QTc . Port CXR 11/28: Personally reviewed by me. Hyperinflation. Increased interstitial markings bilaterally with patchy opacification in the right mid and lower lung zone. Right main stem intubation by endotracheal tube. CT chest 11/30: 1. Study is positive for segmental sized pulmonary embolus to the left upper lobe with some extension into small subsegmental sized vessels. This appears nearly completely occlusive. 2. Severe multilobar bronchopneumonia, as detailed above. 3. Aortic atherosclerosis, in addition to left anterior descending coronary artery disease.  ECHO 12/1>>> [place] LEs 12/1>>>  MICROBIOLOGY: Blood Ctx x2 11/28 >> pending Urine Ctx 11/28 >>  negative Influenza A PCR 11/28:  Negative  Resp Ctx 11/28 >> rare GPC pairs>>> resp viral panel: neg  ANTIBIOTICS: Vancomycin 11/28 >11/30 Zosyn 11/28 >>11/30 Rocephin 11/30>>> Doxycycline 11/28 >> 11/29  SIGNIFICANT EVENTS: 11/28 - Presented to [facility]>>intubated for hypoxia/pneumonia>>[facility] - Spike fever 38.4 over night  LINES/TUBES: OETT 7.0 11/28  >>12/1 OGT 11/28 >>12/1 FOLEY 11/28 >> R Chest Port >> PIV  DISCUSSION:   60 y.o. female presenting with acute encephalopathy and acute hypoxic respiratory failure. Severe community acquired pneumonia with viral prodrome is most likely etiology as U strep positive. Her O2 demands seemed out of proportion w/ her CXR so we obtained a CT chest on 11/30 and confirmed suspicion of PE. Today she: the plan for today is: extubate. Wean O2, cont BDs, obtain ECHO, LE US and initiate rehab efforts. Also need to confirm DNR/DNI status. Has been DNR in past. Can be SDU status and triad in am 12/2  ASSESSMENT / PLAN:  PULMONARY A: Acute Hypoxic Respiratory Failure - Likely due to pneumonia but c/b PE Acute Pulmonary emboli  Probable COPD H/O Tobacco Use Disorder cxr improving  Barrier to weaning seems to be agitation and delirium and Pulmonary emboli. Heparin gtt started 11/30 -->she is much better and passed SBT P:   Extubate Wean FIo2 PRN CXR precedex for goal RASS 0 to -1 Cont LMWH Repeat ECHO   INFECTIOUS A:   Sepsis Severe Community Acquired Pneumonia -u-strep positive so likely pneumococcal  -influenza negative as well as RVP - spiking fevers 11/30 early am but cxr, and PCT reassuring.  P:   Changed to rocephin; Trending Procalcitonin per Algorithm  NEUROLOGIC A:   Acute Encephalopathy - Likely multifactorial from toxic metabolic & hypoxia. Sedation on Ventilator Chronic Pain Syndrome  P:   RASS goal: 0  Holding Fentanyl Duragesic Patch dc precedex  CARDIOVASCULAR A:  H/O HTN H/O Non-sustained V tach H/O NSTEMI Sepsis  Hypotension 12/1 (? Drug related) got 1 liter NS  P:  Continuous Telemetry Monitoring  Vitals per Unit Protocol Goal MAP >65 Place hold orders for lopressor   RENAL A:   Lactic Acidosis - Resolved. Hypernatremia -->worse P:   Trending UOP with Foley Monitoring electrolytes and renal function daily Replacing electrolytes as indicated Free  water replacement w/ D5W  GASTROINTESTINAL A:   Diarrhea/Stool Incontinence - Likely due to encephalopathy. Severe Protein-Calorie Malnutrition H/O IBS  P:   NPO Protonix VT daily Tube Feedings   HEMATOLOGIC A:   Thrombocytopenia - Mild. Pulmonary emboli   P:  Trending cell counts daily w/ CBC LMWH (PE) Ck LE dopplers  PTT & INR  ENDOCRINE A:   H/O Hypothyroidism Hyperglycemia - Mild. No h/o DM.  P:   Continuing Synthroid  IV d/t prior issues w/ po absorption  Checking Hgb A1c > pending Accu-Checks q4hr with MD notification parameters  SSI per protocol  FAMILY  - Updates:  Niece updated at bedside 11/28 by Dr. Jamison NeighborNestor.  - Inter-disciplinary family meet or Palliative Care meeting due by:  12/5  My critical care time  45  minutes Simonne MartinetPeter E Babcock ACNP-BC [facility] Pager # [phone] OR # [phone] if no answer   STAFF NOTE: I, Rory Percyaniel Shakil Dirk, MD FACP have personally reviewed patient's available data, including medical history, events of note, physical examination and test results as part of my evaluation. I have discussed with resident/NP and other care providers such as pharmacist, RN and RRT. In addition, I personally evaluated patient  and elicited key findings of: awakens, coarse BS, edema legs, viral neg, strep urine AG pos, resp culture since 28th is neg, reaosonable to narrow to cftiaxone, and treat 8 days, weaning aggressive, cpap 5 ps5, goal 1 hr, rsbi wnl, does fc, extubated, need to re address reintubation status, Na rise, treat free water d5w and follow chem in am , IS , CT reviewed and positive, heparin drip, getting dopplers The patient is critically ill with multiple organ systems failure and requires high complexity decision making for assessment and support, frequent evaluation and titration of therapies, application of advanced monitoring technologies and extensive interpretation of multiple databases.   Critical Care Time devoted  to patient care services described in this note is 30 Minutes. This time reflects time of care of this signee: Rory Percy, MD FACP. This critical care time does not reflect procedure time, or teaching time or supervisory time of PA/NP/Med student/Med Resident etc but could involve care discussion time. Rest per NP/medical resident whose note is outlined above and that I agree with   Mcarthur Rossetti. Tyson Alias, MD, FACP Pgr: [phone] [facility] 08/06/2016 11:16 AM

## 2016-08-06 NOTE — Progress Notes (Signed)
Interdisciplinary Goals of Care Family Meeting    Date carried out:: 08/06/2016  Location of the meeting: Bedside  Member's involved: Nurse Practitioner and Bedside Registered Nurse  Durable Power of Attorney or acting medical decision maker:  - pt and her niece   Discussion: We discussed goals of care for [name] .  Full DNR   Code status: Full DNR  Disposition: Continue current acute care  Time spent for the meeting: 30 minutes   Shelby Mattocksete E Kaelani Kendrick 08/06/2016 3:15 PM  Simonne MartinetPeter E Krzysztof Reichelt ACNP-BC [facility] Pager # [phone] OR # [phone] if no answer

## 2016-08-06 NOTE — Progress Notes (Signed)
Nutrition Follow-up  DOCUMENTATION CODES:   Severe malnutrition in context of chronic illness, Underweight  INTERVENTION:   Diet advancement per MD Will discuss protein supplement options upon diet advancement.  If re-intubated resume tube feeding:  Osmolite 1.2 at 45 mL/hr. At goal rate of Osmolite 1.2 @ 45 TF regimen will provide 1296 kcal, 60 grams of protein, and 886 mL free water.  RD to monitor for plan  NUTRITION DIAGNOSIS:   Malnutrition related to chronic illness as evidenced by severe depletion of muscle mass, severe depletion of body fat.  Ongoing.  GOAL:   Patient will meet greater than or equal to 90% of their needs  Not meeting.  MONITOR:   Diet advancement, Labs, Weight trends, I & O's  ASSESSMENT:   60 y.o. female currently intubated. History obtained from the patient's electronic medical record. Patient with known history of chronic pain on chronic pain medication as well as supraventricular tachycardia. Patient reportedly in her usual state of health last Friday when last seen. Patient currently living alone. At that time patient was complaining of feeling as though she had a "virus" with some coughing. Her friend had to have management (her door today when she arrived. Patient was found sitting on her couch covered in stool. Patient was responsive with only eye-opening to her name and was only able to follow some commands without giving any history in the emergency department. Patient required nonrebreather mask to improve her desaturation but with ongoing confusion and no close family or friends otherwise the decision was made to intubate the patient. Transfer was requested to our facility for ongoing treatment of her acute hypoxic respiratory failure. The patient's niece showed up at the end of my exam reporting that the patient is very secretive about her medical health and history. Reportedly [place]  but we are unsure regarding what form of treatment or exactly where this is being undertaken. She reports she last saw her aunt normal just prior to Thanksgiving.  Patient extubated this am. Tube feedings have been stopped. Pt's weight is + 19 lb since admission. Upon diet advancement, will discuss protein supplement options given severe malnutrition and underweight status. Re-estimated needs below.  Patient is currently intubated on ventilator support MV: 12.1 L/min Temp (24hrs), Avg:99.4 F (37.4 C), Min:98.6 F (37 C), Max:100 F (37.8 C)  Propofol: none  Medications: D5 infusion at 40 ml/hr provides 163 kcal Labs reviewed: CBGs: 224 Elevated Na Mg/Phos/K WNL  Diet Order:  Diet NPO time specified  Skin:  Reviewed, no issues  Last BM:  11/28  Height:   Ht Readings from Last 1 Encounters:  08/03/16 5\' 2"  (1.575 m)    Weight:   Wt Readings from Last 1 Encounters:  08/06/16 95 lb 7.4 oz (43.3 kg)    Ideal Body Weight:  50 kg  BMI:  Body mass index is 17.46 kg/m.  Estimated Nutritional Needs:   Kcal:  1250-1450  Protein:  55-65g  Fluid:  1.5 L/day  EDUCATION NEEDS:   No education needs identified at this time  Tilda FrancoLindsey Carmie Lanpher, MS, RD, LDN Pager: [phone] After Hours Pager: [phone]

## 2016-08-06 NOTE — Progress Notes (Signed)
eLink Physician-Brief Progress Note Patient Name: Olivia StaggersMartha A [name] DOB: [birth year] MRN: [id number]   Date of Service  08/06/2016  HPI/Events of Note  Patient had some chest discomfort earlier. She is a DO NOT RESUSCITATE.   EKG done with sinus tachycardia.   Pt seen. Currently comfortable.  Asleep.   eICU Interventions  Will observe for now.      Intervention Category Major Interventions: Other:  Louann SjogrenJose Angelo A De Dios 08/06/2016, 8:46 PM

## 2016-08-06 NOTE — Progress Notes (Signed)
.**  Preliminary report by tech**  Bilateral lower extremity venous duplex completed. There is no evidence of deep or superficial vein thrombosis involving the right and left lower extremities. All visualized vessels appear patent and compressible. There is no evidence of Baker's cysts bilaterally. Results were given to the patient's nurse, Revonda StandardAllison.  08/06/16 9:46 AM Olen CordialGreg Blondie Riggsbee RVT

## 2016-08-07 ENCOUNTER — Inpatient Hospital Stay (HOSPITAL_COMMUNITY): Payer: Medicare Other

## 2016-08-07 DIAGNOSIS — F17200 Nicotine dependence, unspecified, uncomplicated: Secondary | ICD-10-CM

## 2016-08-07 DIAGNOSIS — J9610 Chronic respiratory failure, unspecified whether with hypoxia or hypercapnia: Secondary | ICD-10-CM

## 2016-08-07 DIAGNOSIS — G894 Chronic pain syndrome: Secondary | ICD-10-CM

## 2016-08-07 DIAGNOSIS — I2699 Other pulmonary embolism without acute cor pulmonale: Secondary | ICD-10-CM

## 2016-08-07 DIAGNOSIS — E039 Hypothyroidism, unspecified: Secondary | ICD-10-CM

## 2016-08-07 LAB — CBC
HEMATOCRIT: 35.6 % — AB (ref 36.0–46.0)
Hemoglobin: 11.7 g/dL — ABNORMAL LOW (ref 12.0–15.0)
MCH: 30.7 pg (ref 26.0–34.0)
MCHC: 32.9 g/dL (ref 30.0–36.0)
MCV: 93.4 fL (ref 78.0–100.0)
Platelets: 219 10*3/uL (ref 150–400)
RBC: 3.81 MIL/uL — ABNORMAL LOW (ref 3.87–5.11)
RDW: 14.6 % (ref 11.5–15.5)
WBC: 11.7 10*3/uL — ABNORMAL HIGH (ref 4.0–10.5)

## 2016-08-07 LAB — COMPREHENSIVE METABOLIC PANEL
ALK PHOS: 47 U/L (ref 38–126)
ALT: 18 U/L (ref 14–54)
ANION GAP: 8 (ref 5–15)
AST: 34 U/L (ref 15–41)
Albumin: 2.2 g/dL — ABNORMAL LOW (ref 3.5–5.0)
BILIRUBIN TOTAL: 1.1 mg/dL (ref 0.3–1.2)
BUN: 16 mg/dL (ref 6–20)
CALCIUM: 7.6 mg/dL — AB (ref 8.9–10.3)
CO2: 25 mmol/L (ref 22–32)
Chloride: 110 mmol/L (ref 101–111)
Creatinine, Ser: 0.59 mg/dL (ref 0.44–1.00)
Glucose, Bld: 100 mg/dL — ABNORMAL HIGH (ref 65–99)
POTASSIUM: 3.3 mmol/L — AB (ref 3.5–5.1)
Sodium: 143 mmol/L (ref 135–145)
TOTAL PROTEIN: 5.6 g/dL — AB (ref 6.5–8.1)

## 2016-08-07 LAB — PHOSPHORUS: PHOSPHORUS: 3.3 mg/dL (ref 2.5–4.6)

## 2016-08-07 LAB — GLUCOSE, CAPILLARY
GLUCOSE-CAPILLARY: 116 mg/dL — AB (ref 65–99)
Glucose-Capillary: 103 mg/dL — ABNORMAL HIGH (ref 65–99)

## 2016-08-07 LAB — ECHOCARDIOGRAM COMPLETE
Height: 62 in
Weight: 1608.48 oz

## 2016-08-07 LAB — MAGNESIUM: Magnesium: 1.7 mg/dL (ref 1.7–2.4)

## 2016-08-07 MED ORDER — LABETALOL HCL 5 MG/ML IV SOLN
INTRAVENOUS | Status: AC
  Filled 2016-08-07: qty 4

## 2016-08-07 MED ORDER — POTASSIUM CHLORIDE CRYS ER 20 MEQ PO TBCR
40.0000 meq | EXTENDED_RELEASE_TABLET | ORAL | Status: AC
  Administered 2016-08-07 (×2): 40 meq via ORAL
  Filled 2016-08-07: qty 2

## 2016-08-07 MED ORDER — LABETALOL HCL 5 MG/ML IV SOLN
10.0000 mg | Freq: Once | INTRAVENOUS | Status: AC
  Administered 2016-08-07: 10 mg via INTRAVENOUS

## 2016-08-07 MED ORDER — SODIUM CHLORIDE 0.9 % IV BOLUS (SEPSIS): 500.0000 mL | Freq: Once | INTRAVENOUS | Status: DC

## 2016-08-07 NOTE — Progress Notes (Signed)
PROGRESS NOTE    [name]  YIR:[id number] DOB: November 07, 1955 DOA: 08/03/2016  PCP: Limmie Patricia, MD   Brief Narrative:  60 y/o female with PMH of chronic pain on narcotics, NSTEMI (2014), NSVT, hypothyroid who developed a cough a few days prior to admission. Her friend found her laying on the couch in her stool, not very responsive- would open eyes and follow some commands in the ER at [facility]. Not much history was obtained as she was soon intubated. She was found to be hypoxic and initially placed on a non-rebreather but, as mentioned, had to be intubated while in the ER. She was transferred to [facility]. CXR revealed diffuse b/l infiltrates. U strep antigen later found to be positive. CT on 11/30 later showed a near occlusive segmental PE in LUL as well and Heparin was started. She was extubated on 12/1.   Subjective: She has no complaints. Confused to time and place.   Assessment & Plan:   Active Problems:   Community acquired pneumonia,   Sepsis  - strep pneumo antigen +, multifocal infiltrates, Procalcitonin 3.39, WBC 12, HR > 100. RR> 20, Temp 102.2 on 12/1  - narrowed to Rocephin  Acute respiratory failure with hypoxia - currently on 8 L O2 via HFNC (A) Pneumonia- as above (B) PE - Lovenox for now - LE duplex negative -  ECHO pending    Hypokalemia - cont to replace  Hypernatremia - currently on D5W at 40 cc/hr- drinking fluids    Chronic pain syndrome - holding Fentanyl patch 100 mcg, MSIR 20 mg Q 4 PRN and Feleril 5 mg TID PRN     Hypothyroidism   - Synthroid IV    Acute encephalopathy - persisting- ? Due to infection- follow    Protein-calorie malnutrition, severe - start supplements when able to tolerate orals    Tobacco use disorder  CAD with stents - Plavix  HTN - started on Metoprolol 50 BID here  DVT prophylaxis: full dose Lovenox Code Status: DNR Family Communication:  Disposition Plan: follow in SDU Consultants:    PCCM Procedures:   Intubation  Antimicrobials:  Anti-infectives    Start     Dose/Rate Route Frequency Ordered Stop   08/06/16 1400  cefTRIAXone (ROCEPHIN) 1 g in dextrose 5 % 50 mL IVPB     1 g 100 mL/hr over 30 Minutes Intravenous Every 24 hours 08/06/16 0844 08/11/16 1359   08/05/16 1400  cefTRIAXone (ROCEPHIN) 2 g in dextrose 5 % 50 mL IVPB  Status:  Discontinued     2 g 100 mL/hr over 30 Minutes Intravenous Every 24 hours 08/05/16 1053 08/06/16 0844   08/04/16 1800  vancomycin (VANCOCIN) IVPB 750 mg/150 ml premix  Status:  Discontinued     750 mg 150 mL/hr over 60 Minutes Intravenous Every 24 hours 08/04/16 0730 08/05/16 1053   08/04/16 1100  doxycycline (VIBRAMYCIN) 100 mg in dextrose 5 % 250 mL IVPB  Status:  Discontinued     100 mg 125 mL/hr over 120 Minutes Intravenous Every 12 hours 08/04/16 1049 08/04/16 1056   08/04/16 0100  piperacillin-tazobactam (ZOSYN) IVPB 3.375 g  Status:  Discontinued     3.375 g 12.5 mL/hr over 240 Minutes Intravenous Every 8 hours 08/03/16 1604 08/05/16 1053   08/03/16 2315  doxycycline (VIBRAMYCIN) 100 mg in dextrose 5 % 250 mL IVPB  Status:  Discontinued     100 mg 125 mL/hr over 120 Minutes Intravenous 2 times daily 08/03/16 2312 08/04/16 1049  08/03/16 1600  piperacillin-tazobactam (ZOSYN) IVPB 3.375 g     3.375 g 100 mL/hr over 30 Minutes Intravenous  Once 08/03/16 1557 08/03/16 1705   08/03/16 1600  vancomycin (VANCOCIN) IVPB 1000 mg/200 mL premix     1,000 mg 200 mL/hr over 60 Minutes Intravenous  Once 08/03/16 1557 08/03/16 2015       Objective: Vitals:   08/07/16 0600 08/07/16 0700 08/07/16 0838 08/07/16 1204  BP: 130/78 (!) 154/81  (!) 160/90  Pulse:    96  Resp: (!) 21 (!) 21    Temp:      TempSrc:      SpO2: (!) 89% (!) 87% 91%   Weight:      Height:        Intake/Output Summary (Last 24 hours) at 08/07/16 1331 Last data filed at 08/07/16 0700  Gross per 24 hour  Intake              890 ml  Output              1160 ml  Net             -270 ml   Filed Weights   08/05/16 0500 08/06/16 0335 08/07/16 0500  Weight: 40.5 kg (89 lb 4.6 oz) 43.3 kg (95 lb 7.4 oz) 45.6 kg (100 lb 8.5 oz)    Examination: General exam: Appears comfortable  HEENT: PERRLA, oral mucosa moist, no sclera icterus or thrush Respiratory system: Clear to auscultation. Respiratory effort normal. Cardiovascular system: S1 & S2 heard, RRR.  No murmurs  Gastrointestinal system: Abdomen soft, non-tender, nondistended. Normal bowel sound. No organomegaly Central nervous system: Alert - confused to time and place-  No focal neurological deficits. Extremities: No cyanosis, clubbing or edema Skin: No rashes or ulcers Psychiatry:  Mood & affect appropriate.     Data Reviewed: I have personally reviewed following labs and imaging studies  CBC:  Recent Labs Lab 08/03/16 1549 08/04/16 0119 08/05/16 0322 08/06/16 0325 08/07/16 0330  WBC 9.0 6.9 12.7* 8.4 11.7*  NEUTROABS 8.0*  --   --   --   --   HGB 13.5 12.1 11.5* 10.3* 11.7*  HCT 40.3 36.1 35.5* 31.6* 35.6*  MCV 92.0 91.4 95.2 97.8 93.4  PLT 202 187 186 145* 300   Basic Metabolic Panel:  Recent Labs Lab 08/03/16 1549 08/04/16 0119 08/04/16 0656 08/05/16 0322 08/06/16 0325 08/07/16 0330  NA 142  --  146* 146* 149* 143  K 2.4*  --  3.6 3.3* 4.1 3.3*  CL 100*  --  110 108 117* 110  CO2 29  --  '25 27 25 25  '$ GLUCOSE 114*  --  146* 149* 167* 100*  BUN 27*  --  32* 38* 22* 16  CREATININE 0.93  --  0.93 1.07* 0.67 0.59  CALCIUM 8.6*  --  8.1* 8.0* 7.4* 7.6*  MG  --  2.2  --  2.2 1.9 1.7  PHOS  --  4.3  --  3.6 2.6 3.3   GFR: Estimated Creatinine Clearance (by C-G formula based on SCr of 0.59 mg/dL) Female: 54.5 mL/min Female: 64.1 mL/min Liver Function Tests:  Recent Labs Lab 08/03/16 1549 08/07/16 0330  AST 19 34  ALT 10* 18  ALKPHOS 57 47  BILITOT 1.7* 1.1  PROT 7.7 5.6*  ALBUMIN 2.8* 2.2*   No results for input(s): LIPASE, AMYLASE in the last 168  hours.  Recent Labs Lab 08/03/16 1611  AMMONIA 13  Coagulation Profile:  Recent Labs Lab 08/05/16 0322  INR 1.06   Cardiac Enzymes:  Recent Labs Lab 08/04/16 0119 08/04/16 0656 08/05/16 0322  TROPONINI <0.03 <0.03 <0.03   BNP (last 3 results) No results for input(s): PROBNP in the last 8760 hours. HbA1C: No results for input(s): HGBA1C in the last 72 hours. CBG:  Recent Labs Lab 08/06/16 1554 08/06/16 2040 08/06/16 2303 08/07/16 0325 08/07/16 0751  GLUCAP 101* 108* 139* 103* 116*   Lipid Profile:  Recent Labs  08/06/16 1737  TRIG 112   Thyroid Function Tests: No results for input(s): TSH, T4TOTAL, FREET4, T3FREE, THYROIDAB in the last 72 hours. Anemia Panel: No results for input(s): VITAMINB12, FOLATE, FERRITIN, TIBC, IRON, RETICCTPCT in the last 72 hours. Urine analysis:    Component Value Date/Time   COLORURINE YELLOW 08/03/2016 1700   APPEARANCEUR CLEAR 08/03/2016 1700   LABSPEC 1.010 08/03/2016 1700   PHURINE 6.0 08/03/2016 1700   GLUCOSEU NEGATIVE 08/03/2016 1700   HGBUR LARGE (A) 08/03/2016 1700   BILIRUBINUR SMALL (A) 08/03/2016 1700   KETONESUR 15 (A) 08/03/2016 1700   PROTEINUR 100 (A) 08/03/2016 1700   UROBILINOGEN 0.2 11/14/2012 2155   NITRITE NEGATIVE 08/03/2016 1700   LEUKOCYTESUR NEGATIVE 08/03/2016 1700   Sepsis Labs: '@LABRCNTIP'$ (procalcitonin:4,lacticidven:4) ) Recent Results (from the past 240 hour(s))  Blood Culture (routine x 2)     Status: None (Preliminary result)   Collection Time: 08/03/16  4:01 PM  Result Value Ref Range Status   Specimen Description BLOOD LEFT ANTECUBITAL  Final   Special Requests BOTTLES DRAWN AEROBIC AND ANAEROBIC 8CC  Final   Culture NO GROWTH 3 DAYS  Final   Report Status PENDING  Incomplete  Blood Culture (routine x 2)     Status: None (Preliminary result)   Collection Time: 08/03/16  4:05 PM  Result Value Ref Range Status   Specimen Description BLOOD RIGHT ANTECUBITAL  Final   Special  Requests   Final    BOTTLES DRAWN AEROBIC AND ANAEROBIC AEB=7CC ANA=5CC   Culture NO GROWTH 3 DAYS  Final   Report Status PENDING  Incomplete  Urine culture     Status: None   Collection Time: 08/03/16  5:00 PM  Result Value Ref Range Status   Specimen Description URINE, CATHETERIZED  Final   Special Requests NONE  Final   Culture NO GROWTH Performed at [facility]   Final   Report Status 08/05/2016 FINAL  Final  Culture, respiratory (NON-Expectorated)     Status: None   Collection Time: 08/03/16 11:32 PM  Result Value Ref Range Status   Specimen Description TRACHEAL ASPIRATE  Final   Special Requests Normal  Final   Gram Stain   Final    FEW WBC PRESENT, PREDOMINANTLY PMN RARE GRAM POSITIVE COCCI IN PAIRS RARE YEAST    Culture   Final    Consistent with normal respiratory flora. Performed at [facility]    Report Status 08/06/2016 FINAL  Final  MRSA PCR Screening     Status: None   Collection Time: 08/04/16 12:34 AM  Result Value Ref Range Status   MRSA by PCR NEGATIVE NEGATIVE Final    Comment:        The GeneXpert MRSA Assay (FDA approved for NASAL specimens only), is one component of a comprehensive MRSA colonization surveillance program. It is not intended to diagnose MRSA infection nor to guide or monitor treatment for MRSA infections.   Respiratory Panel by PCR     Status:  None   Collection Time: 08/04/16 12:34 AM  Result Value Ref Range Status   Adenovirus NOT DETECTED NOT DETECTED Final   Coronavirus 229E NOT DETECTED NOT DETECTED Final   Coronavirus HKU1 NOT DETECTED NOT DETECTED Final   Coronavirus NL63 NOT DETECTED NOT DETECTED Final   Coronavirus OC43 NOT DETECTED NOT DETECTED Final   Metapneumovirus NOT DETECTED NOT DETECTED Final   Rhinovirus / Enterovirus NOT DETECTED NOT DETECTED Final   Influenza A NOT DETECTED NOT DETECTED Final   Influenza B NOT DETECTED NOT DETECTED Final   Parainfluenza Virus 1 NOT DETECTED NOT DETECTED  Final   Parainfluenza Virus 2 NOT DETECTED NOT DETECTED Final   Parainfluenza Virus 3 NOT DETECTED NOT DETECTED Final   Parainfluenza Virus 4 NOT DETECTED NOT DETECTED Final   Respiratory Syncytial Virus NOT DETECTED NOT DETECTED Final   Bordetella pertussis NOT DETECTED NOT DETECTED Final   Chlamydophila pneumoniae NOT DETECTED NOT DETECTED Final   Mycoplasma pneumoniae NOT DETECTED NOT DETECTED Final    Comment: Performed at [facility]         Radiology Studies: Ct Angio Chest Pe W Or Wo Contrast  Result Date: 08/05/2016 CLINICAL DATA:  60 year old female with history of acute hypoxic respiratory failure and acute encephalopathy. Community acquired pneumonia. EXAM: CT ANGIOGRAPHY CHEST WITH CONTRAST TECHNIQUE: Multidetector CT imaging of the chest was performed using the standard protocol during bolus administration of intravenous contrast. Multiplanar CT image reconstructions and MIPs were obtained to evaluate the vascular anatomy. CONTRAST:  75 mL of Isovue 370. COMPARISON:  No priors. FINDINGS: Cardiovascular: In a segmental sized branch of the left upper lobe extending into subsegmental sized branches there is a filling defect which appears nearly completely occlusive, compatible with pulmonary embolism. No other larger central or lobar sized filling defects are noted. Heart size is normal. Small amount of pericardial fluid and/or thickening, unlikely to be of any hemodynamic significance at this time. No associated pericardial calcification. There is aortic atherosclerosis, as well as atherosclerosis of the great vessels of the mediastinum and the coronary arteries, including calcified atherosclerotic plaque in the left anterior descending coronary artery. Right subclavian single-lumen porta cath with tip terminating in the proximal superior vena cava. Mediastinum/Nodes: Multiple borderline enlarged mediastinal and hilar lymph nodes are noted. No pathologically enlarged mediastinal  or hilar lymph nodes are noted. Esophagus is unremarkable in appearance. Nasogastric tube extends into the stomach. No axillary lymphadenopathy. Lungs/Pleura: There is widespread bronchial wall thickening, patchy areas of thickening of the peribronchovascular interstitium, patchy areas of mild cylindrical bronchiectasis, and extensive peribronchovascular airspace consolidation, compatible with multilobar bronchopneumonia. Involvement is most severe in the lower lobes of the lungs bilaterally, but all lobes are involved. Moderate centrilobular and paraseptal emphysema. No pleural effusions. Upper Abdomen: Nasogastric tube terminating in the stomach. Aortic atherosclerosis. Status post cholecystectomy. Musculoskeletal: There are no aggressive appearing lytic or blastic lesions noted in the visualized portions of the skeleton. Review of the MIP images confirms the above findings. IMPRESSION: 1. Study is positive for segmental sized pulmonary embolus to the left upper lobe with some extension into small subsegmental sized vessels. This appears nearly completely occlusive. 2. Severe multilobar bronchopneumonia, as detailed above. 3. Aortic atherosclerosis, in addition to left anterior descending coronary artery disease. Please note that although the presence of coronary artery calcium documents the presence of coronary artery disease, the severity of this disease and any potential stenosis cannot be assessed on this non-gated CT examination. Assessment for potential risk factor modification, dietary  therapy or pharmacologic therapy may be warranted, if clinically indicated. 4. Additional incidental findings, as above. These results will be called to the ordering clinician or representative by the Radiologist Assistant, and communication documented in the PACS or zVision Dashboard. Electronically Signed   By: Vinnie Langton M.D.   On: 08/05/2016 16:30   Dg Chest Port 1 View  Result Date: 08/06/2016 CLINICAL DATA:   Pneumonia. EXAM: PORTABLE CHEST 1 VIEW COMPARISON:  Of an 30 17. FINDINGS: Support tubes and lines appear stable.  No pneumothorax. Patchy pulmonary opacities better visualized on CT, stable. Marked hyperinflation consistent with COPD. IMPRESSION: Stable chest. Electronically Signed   By: Staci Righter M.D.   On: 08/06/2016 06:44      Scheduled Meds: . cefTRIAXone (ROCEPHIN)  IV  1 g Intravenous Q24H  . chlorhexidine gluconate (MEDLINE KIT)  15 mL Mouth Rinse BID  . clopidogrel  75 mg Oral Daily  . enoxaparin (LOVENOX) injection  40 mg Subcutaneous Q12H  . ipratropium-albuterol  3 mL Nebulization Q4H  . levothyroxine  157 mcg Intravenous Daily  . mouth rinse  15 mL Mouth Rinse QID  . metoprolol tartrate  50 mg Oral BID  . pantoprazole  40 mg Oral Q1200  . potassium chloride  40 mEq Oral Q4H   Continuous Infusions: . dextrose 1,000 mL (08/07/16 1036)     LOS: 4 days    Time spent in minutes: 57    Santiago, MD [facility] Pager: [email or web address] Password [email or web address] 08/07/2016, 1:31 PM

## 2016-08-07 NOTE — Progress Notes (Signed)
*  PRELIMINARY RESULTS* Echocardiogram 2D Echocardiogram has been performed.  Doristine SectionKristy H Isley Zinni 08/07/2016, 9:33 AM

## 2016-08-07 NOTE — Progress Notes (Deleted)
eLink Physician-Brief Progress Note Patient Name: Olivia StaggersMartha A XXXDixon DOB: 11-Jun-1956 MRN: [id number]   Date of Service  08/07/2016  HPI/Events of Note  Persistent and slightly worsening shock. CVP 5. Patient currently in normal sinus rhythm. Previous labs pending.   eICU Interventions  1. Normal saline 500 mL bolus now 2. Applying noninvasive monitoring for stroke volume variation      Intervention Category Major Interventions: Sepsis - evaluation and management;Shock - evaluation and management  Lawanda CousinsJennings Mateo Overbeck 08/07/2016, 2:20 AM

## 2016-08-07 NOTE — Progress Notes (Signed)
PT Cancellation Note  Patient Details Name: [name] MRN: [id number] DOB: Apr 27, 1956   Cancelled Treatment:     PT order received but eval deferred 2* RN report of pt lethargy and large number of visitors.  Will follow.   [name] 08/07/2016, 5:29 PM

## 2016-08-07 NOTE — Progress Notes (Signed)
eLink Physician-Brief Progress Note Patient Name: [name] DOB: Jun 30, 1956 MRN: [id number]   Date of Service  08/07/2016  HPI/Events of Note  Patient with persistent hypertension despite hydralazine 40 mg IV push. Blood pressure confirmed with manual cuff.   eICU Interventions  Labetalol 10 mg IV 1      Intervention Category Intermediate Interventions: Hypertension - evaluation and management  Lawanda CousinsJennings Nestor 08/07/2016, 2:15 AM

## 2016-08-08 LAB — BASIC METABOLIC PANEL
ANION GAP: 8 (ref 5–15)
BUN: 15 mg/dL (ref 6–20)
CO2: 22 mmol/L (ref 22–32)
Calcium: 7.9 mg/dL — ABNORMAL LOW (ref 8.9–10.3)
Chloride: 109 mmol/L (ref 101–111)
Creatinine, Ser: 0.65 mg/dL (ref 0.44–1.00)
Glucose, Bld: 105 mg/dL — ABNORMAL HIGH (ref 65–99)
POTASSIUM: 4.6 mmol/L (ref 3.5–5.1)
Sodium: 139 mmol/L (ref 135–145)

## 2016-08-08 LAB — CBC
HCT: 37.6 % (ref 36.0–46.0)
Hemoglobin: 12.4 g/dL (ref 12.0–15.0)
MCH: 30.5 pg (ref 26.0–34.0)
MCHC: 33 g/dL (ref 30.0–36.0)
MCV: 92.6 fL (ref 78.0–100.0)
PLATELETS: 309 10*3/uL (ref 150–400)
RBC: 4.06 MIL/uL (ref 3.87–5.11)
RDW: 14.6 % (ref 11.5–15.5)
WBC: 13.2 10*3/uL — AB (ref 4.0–10.5)

## 2016-08-08 LAB — PHOSPHORUS: PHOSPHORUS: 2.8 mg/dL (ref 2.5–4.6)

## 2016-08-08 LAB — MAGNESIUM: MAGNESIUM: 1.7 mg/dL (ref 1.7–2.4)

## 2016-08-08 MED ORDER — FUROSEMIDE 10 MG/ML IJ SOLN
40.0000 mg | Freq: Every day | INTRAMUSCULAR | Status: AC
  Administered 2016-08-08 – 2016-08-09 (×2): 40 mg via INTRAVENOUS
  Filled 2016-08-08 (×2): qty 4

## 2016-08-08 MED ORDER — ENSURE ENLIVE PO LIQD
237.0000 mL | Freq: Two times a day (BID) | ORAL | Status: DC
  Administered 2016-08-08: 237 mL via ORAL

## 2016-08-08 NOTE — Progress Notes (Signed)
Nutrition Follow-up  DOCUMENTATION CODES:   Severe malnutrition in context of chronic illness, Underweight  INTERVENTION:   Provide Ensure Enlive po BID, each supplement provides 350 kcal and 20 grams of protein Encourage PO intake May need to resume nutrition support if patient continues with poor PO intake. RD to continue to monitor for needs  NUTRITION DIAGNOSIS:   Malnutrition related to chronic illness as evidenced by severe depletion of muscle mass, severe depletion of body fat.  Ongoing.  GOAL:   Patient will meet greater than or equal to 90% of their needs  Not meeting.  MONITOR:   PO intake, Labs, Weight trends, I & O's, Supplement acceptance  ASSESSMENT:   60 y.o. female currently intubated. History obtained from the patient's electronic medical record. Patient with known history of chronic pain on chronic pain medication as well as supraventricular tachycardia. Patient reportedly in her usual state of health last Friday when last seen. Patient currently living alone. At that time patient was complaining of feeling as though she had a "virus" with some coughing. Her friend had to have management (her door today when she arrived. Patient was found sitting on her couch covered in stool. Patient was responsive with only eye-opening to her name and was only able to follow some commands without giving any history in the emergency department. Patient required nonrebreather mask to improve her desaturation but with ongoing confusion and no close family or friends otherwise the decision was made to intubate the patient. Transfer was requested to our facility for ongoing treatment of her acute hypoxic respiratory failure. The patient's niece showed up at the end of my exam reporting that the patient is very secretive about her medical health and history. Reportedly she has been undergoing some form of treatment here in [place] but we are unsure regarding what form of treatment or  exactly where this is being undertaken. She reports she last saw her aunt normal just prior to Thanksgiving.  Patient in room with no family at bedside. Pt slow to respond to questions and mumbles responses. Discussed protein supplements with patient and their benefits with patient. Pt requested that RD "Stop right there" with visit. RD to order Ensure supplements given underweight status and severe malnutrition. Pt states she did not order breakfast. Now on soft diet. Pt's weight is now +20 since admission on 11/28.  Medications: IV Lasix daily Labs reviewed: CBGs: 116  Diet Order:  DIET SOFT Room service appropriate? Yes; Fluid consistency: Thin  Skin:  Reviewed, no issues  Last BM:  11/29  Height:   Ht Readings from Last 1 Encounters:  08/03/16 5\' 2"  (1.575 m)    Weight:   Wt Readings from Last 1 Encounters:  08/08/16 96 lb 9 oz (43.8 kg)    Ideal Body Weight:  50 kg  BMI:  Body mass index is 17.66 kg/m.  Estimated Nutritional Needs:   Kcal:  1250-1450  Protein:  55-65g  Fluid:  1.5 L/day  EDUCATION NEEDS:   No education needs identified at this time  Tilda FrancoLindsey Jaqua Ching, MS, RD, LDN Pager: [phone] After Hours Pager: [phone]

## 2016-08-08 NOTE — Progress Notes (Signed)
ANTICOAGULATION CONSULT NOTE - Follow Up Consult  Pharmacy Consult for Lovenox Indication: new pulmonary embolus  Allergies  Allergen Reactions  . Aspirin Rash  . Calcium-Containing Compounds Rash  . Codeine Rash  . Tagamet [Cimetidine] Rash    Patient Measurements: Height: 5\' 2"  (157.5 cm) Weight: 96 lb 9 oz (43.8 kg) IBW/kg (Calculated) : 50.1   Vital Signs: Temp: 99.1 F (37.3 C) (12/03 0800) BP: 187/85 (12/03 0800)  Labs:  Recent Labs  08/05/16 1042  08/06/16 0325 08/07/16 0330 08/08/16 0343  HGB  --   < > 10.3* 11.7* 12.4  HCT  --   --  31.6* 35.6* 37.6  PLT  --   --  145* 219 309  APTT 27  --   --   --   --   CREATININE  --   --  0.67 0.59 0.65  < > = values in this interval not displayed.  Estimated Creatinine Clearance (by C-G formula based on SCr of 0.65 mg/dL) Female: 40.952.4 mL/min Female: 61.6 mL/min   Assessment: 60yo female with PMHx chronic pain, SVTs, and IBS found covered in stool at home and was transported via EMS to the ED on 11/28.  She was subsequently intubated on admission and was found to have new PE per CTA on 11/30.  She's currently on lovenox for VTE treatment.  Today, 08/08/2016: - cbc stable - no bleeding documented   Goal of Therapy:  Anti-Xa level 0.6-1 units/ml 4hrs after LMWH dose given Monitor platelets by anticoagulation protocol: Yes   Plan:  - continue lovenox 40 mg SQ q12h - patient's taking oral medications. Consider changing to an oral anticoagulation if/when appropriate. - monitor for s/s bleeding  Yeison Sippel P 08/08/2016,10:24 AM

## 2016-08-08 NOTE — Progress Notes (Signed)
PROGRESS NOTE    [name]  CXK:[id number] DOB: 1956-01-28 DOA: 08/03/2016  PCP: Limmie Patricia, MD   Brief Narrative:  60 y/o female with PMH of chronic pain on narcotics, NSTEMI (2014), NSVT, hypothyroid who developed a cough a few days prior to admission. Her friend found her laying on the couch in her stool, not very responsive- would open eyes and follow some commands in the ER at [facility]. Not much history was obtained as she was soon intubated. She was found to be hypoxic and initially placed on a non-rebreather but, as mentioned, had to be intubated while in the ER. She was transferred to [facility]. CXR revealed diffuse b/l infiltrates. U strep antigen later found to be positive. CT on 11/30 later showed a near occlusive segmental PE in LUL as well and Heparin was started. She was extubated on 12/1.   Subjective: She has no complaints.    Assessment & Plan:   Active Problems:   Community acquired pneumonia,   Sepsis  - strep pneumo antigen +, multifocal infiltrates, Procalcitonin 3.39, WBC 12, HR > 100. RR> 20, Temp 102.2 on 12/1  - narrowed to Rocephin  Acute respiratory failure with hypoxia - currently on 8 L O2 via HFNC (A) Pneumonia- as above (B) PE - Lovenox for now - LE duplex negative -  ECHO was a poor study- Left ventricle: The cavity size was normal. Systolic function was   vigorous. The estimated ejection fraction was in the range of 65%   to 70%.    Hypokalemia -   replace as needed  Hypernatremia - resolved - will d/c D5W at 40 cc/hr   Fluid overloaded - Lasix 40 mg IV daily x 2 doses    Chronic pain syndrome - holding Fentanyl patch 100 mcg, MSIR 20 mg Q 4 PRN and Feleril 5 mg TID PRN     Hypothyroidism   - Synthroid IV    Acute encephalopathy - persisting- ? Due to infection- follow    Protein-calorie malnutrition, severe - start supplements when able to tolerate orals    Tobacco use disorder  CAD with stents -  Plavix  HTN - started on Metoprolol 50 BID -PRN Hydralazine, LAsix for fluid overload  DVT prophylaxis: full dose Lovenox Code Status: DNR Family Communication:  Disposition Plan: transfer to med/surg Consultants:   PCCM Procedures:   Intubation  Antimicrobials:  Anti-infectives    Start     Dose/Rate Route Frequency Ordered Stop   08/06/16 1400  cefTRIAXone (ROCEPHIN) 1 g in dextrose 5 % 50 mL IVPB     1 g 100 mL/hr over 30 Minutes Intravenous Every 24 hours 08/06/16 0844 08/11/16 1359   08/05/16 1400  cefTRIAXone (ROCEPHIN) 2 g in dextrose 5 % 50 mL IVPB  Status:  Discontinued     2 g 100 mL/hr over 30 Minutes Intravenous Every 24 hours 08/05/16 1053 08/06/16 0844   08/04/16 1800  vancomycin (VANCOCIN) IVPB 750 mg/150 ml premix  Status:  Discontinued     750 mg 150 mL/hr over 60 Minutes Intravenous Every 24 hours 08/04/16 0730 08/05/16 1053   08/04/16 1100  doxycycline (VIBRAMYCIN) 100 mg in dextrose 5 % 250 mL IVPB  Status:  Discontinued     100 mg 125 mL/hr over 120 Minutes Intravenous Every 12 hours 08/04/16 1049 08/04/16 1056   08/04/16 0100  piperacillin-tazobactam (ZOSYN) IVPB 3.375 g  Status:  Discontinued     3.375 g 12.5 mL/hr over 240 Minutes Intravenous  Every 8 hours 08/03/16 1604 08/05/16 1053   08/03/16 2315  doxycycline (VIBRAMYCIN) 100 mg in dextrose 5 % 250 mL IVPB  Status:  Discontinued     100 mg 125 mL/hr over 120 Minutes Intravenous 2 times daily 08/03/16 2312 08/04/16 1049   08/03/16 1600  piperacillin-tazobactam (ZOSYN) IVPB 3.375 g     3.375 g 100 mL/hr over 30 Minutes Intravenous  Once 08/03/16 1557 08/03/16 1705   08/03/16 1600  vancomycin (VANCOCIN) IVPB 1000 mg/200 mL premix     1,000 mg 200 mL/hr over 60 Minutes Intravenous  Once 08/03/16 1557 08/03/16 2015       Objective: Vitals:   08/08/16 0418 08/08/16 0423 08/08/16 0800 08/08/16 0845  BP:  (!) 169/88 (!) 187/85   Pulse:      Resp:   (!) 29   Temp:   99.1 F (37.3 C)   TempSrc:       SpO2:   99% 92%  Weight: 43.8 kg (96 lb 9 oz)     Height:        Intake/Output Summary (Last 24 hours) at 08/08/16 1033 Last data filed at 08/08/16 0800  Gross per 24 hour  Intake             1650 ml  Output              610 ml  Net             1040 ml   Filed Weights   08/06/16 0335 08/07/16 0500 08/08/16 0418  Weight: 43.3 kg (95 lb 7.4 oz) 45.6 kg (100 lb 8.5 oz) 43.8 kg (96 lb 9 oz)    Examination: General exam: Appears comfortable  HEENT: PERRLA, oral mucosa moist, no sclera icterus or thrush Respiratory system: Clear to auscultation. Respiratory effort normal. Cardiovascular system: S1 & S2 heard, RRR.  No murmurs  Gastrointestinal system: Abdomen soft, non-tender, nondistended. Normal bowel sound. No organomegaly Central nervous system: Alert - confused to time and place-  No focal neurological deficits. Extremities: No cyanosis, clubbing or edema Skin: No rashes or ulcers Psychiatry:  Mood & affect appropriate.     Data Reviewed: I have personally reviewed following labs and imaging studies  CBC:  Recent Labs Lab 08/03/16 1549 08/04/16 0119 08/05/16 0322 08/06/16 0325 08/07/16 0330 08/08/16 0343  WBC 9.0 6.9 12.7* 8.4 11.7* 13.2*  NEUTROABS 8.0*  --   --   --   --   --   HGB 13.5 12.1 11.5* 10.3* 11.7* 12.4  HCT 40.3 36.1 35.5* 31.6* 35.6* 37.6  MCV 92.0 91.4 95.2 97.8 93.4 92.6  PLT 202 187 186 145* 219 655   Basic Metabolic Panel:  Recent Labs Lab 08/04/16 0119 08/04/16 0656 08/05/16 0322 08/06/16 0325 08/07/16 0330 08/08/16 0343  NA  --  146* 146* 149* 143 139  K  --  3.6 3.3* 4.1 3.3* 4.6  CL  --  110 108 117* 110 109  CO2  --  '25 27 25 25 22  '$ GLUCOSE  --  146* 149* 167* 100* 105*  BUN  --  32* 38* 22* 16 15  CREATININE  --  0.93 1.07* 0.67 0.59 0.65  CALCIUM  --  8.1* 8.0* 7.4* 7.6* 7.9*  MG 2.2  --  2.2 1.9 1.7 1.7  PHOS 4.3  --  3.6 2.6 3.3 2.8   GFR: Estimated Creatinine Clearance (by C-G formula based on SCr of 0.65  mg/dL) Female: 52.4 mL/min Female: 61.6 mL/min  Liver Function Tests:  Recent Labs Lab 08/03/16 1549 08/07/16 0330  AST 19 34  ALT 10* 18  ALKPHOS 57 47  BILITOT 1.7* 1.1  PROT 7.7 5.6*  ALBUMIN 2.8* 2.2*   No results for input(s): LIPASE, AMYLASE in the last 168 hours.  Recent Labs Lab 08/03/16 1611  AMMONIA 13   Coagulation Profile:  Recent Labs Lab 08/05/16 0322  INR 1.06   Cardiac Enzymes:  Recent Labs Lab 08/04/16 0119 08/04/16 0656 08/05/16 0322  TROPONINI <0.03 <0.03 <0.03   BNP (last 3 results) No results for input(s): PROBNP in the last 8760 hours. HbA1C: No results for input(s): HGBA1C in the last 72 hours. CBG:  Recent Labs Lab 08/06/16 1554 08/06/16 2040 08/06/16 2303 08/07/16 0325 08/07/16 0751  GLUCAP 101* 108* 139* 103* 116*   Lipid Profile:  Recent Labs  08/06/16 1737  TRIG 112   Thyroid Function Tests: No results for input(s): TSH, T4TOTAL, FREET4, T3FREE, THYROIDAB in the last 72 hours. Anemia Panel: No results for input(s): VITAMINB12, FOLATE, FERRITIN, TIBC, IRON, RETICCTPCT in the last 72 hours. Urine analysis:    Component Value Date/Time   COLORURINE YELLOW 08/03/2016 1700   APPEARANCEUR CLEAR 08/03/2016 1700   LABSPEC 1.010 08/03/2016 1700   PHURINE 6.0 08/03/2016 1700   GLUCOSEU NEGATIVE 08/03/2016 1700   HGBUR LARGE (A) 08/03/2016 1700   BILIRUBINUR SMALL (A) 08/03/2016 1700   KETONESUR 15 (A) 08/03/2016 1700   PROTEINUR 100 (A) 08/03/2016 1700   UROBILINOGEN 0.2 11/14/2012 2155   NITRITE NEGATIVE 08/03/2016 1700   LEUKOCYTESUR NEGATIVE 08/03/2016 1700   Sepsis Labs: '@LABRCNTIP'$ (procalcitonin:4,lacticidven:4) ) Recent Results (from the past 240 hour(s))  Blood Culture (routine x 2)     Status: None (Preliminary result)   Collection Time: 08/03/16  4:01 PM  Result Value Ref Range Status   Specimen Description BLOOD LEFT ANTECUBITAL  Final   Special Requests BOTTLES DRAWN AEROBIC AND ANAEROBIC 8CC  Final    Culture NO GROWTH 3 DAYS  Final   Report Status PENDING  Incomplete  Blood Culture (routine x 2)     Status: None (Preliminary result)   Collection Time: 08/03/16  4:05 PM  Result Value Ref Range Status   Specimen Description BLOOD RIGHT ANTECUBITAL  Final   Special Requests   Final    BOTTLES DRAWN AEROBIC AND ANAEROBIC AEB=7CC ANA=5CC   Culture NO GROWTH 3 DAYS  Final   Report Status PENDING  Incomplete  Urine culture     Status: None   Collection Time: 08/03/16  5:00 PM  Result Value Ref Range Status   Specimen Description URINE, CATHETERIZED  Final   Special Requests NONE  Final   Culture NO GROWTH Performed at [facility]   Final   Report Status 08/05/2016 FINAL  Final  Culture, respiratory (NON-Expectorated)     Status: None   Collection Time: 08/03/16 11:32 PM  Result Value Ref Range Status   Specimen Description TRACHEAL ASPIRATE  Final   Special Requests Normal  Final   Gram Stain   Final    FEW WBC PRESENT, PREDOMINANTLY PMN RARE GRAM POSITIVE COCCI IN PAIRS RARE YEAST    Culture   Final    Consistent with normal respiratory flora. Performed at [facility] ([place])    Report Status 08/06/2016 FINAL  Final  MRSA PCR Screening     Status: None   Collection Time: 08/04/16 12:34 AM  Result Value Ref Range Status   MRSA by PCR NEGATIVE NEGATIVE Final  Comment:        The GeneXpert MRSA Assay (FDA approved for NASAL specimens only), is one component of a comprehensive MRSA colonization surveillance program. It is not intended to diagnose MRSA infection nor to guide or monitor treatment for MRSA infections.   Respiratory Panel by PCR     Status: None   Collection Time: 08/04/16 12:34 AM  Result Value Ref Range Status   Adenovirus NOT DETECTED NOT DETECTED Final   Coronavirus 229E NOT DETECTED NOT DETECTED Final   Coronavirus HKU1 NOT DETECTED NOT DETECTED Final   Coronavirus NL63 NOT DETECTED NOT DETECTED Final   Coronavirus OC43 NOT DETECTED NOT  DETECTED Final   Metapneumovirus NOT DETECTED NOT DETECTED Final   Rhinovirus / Enterovirus NOT DETECTED NOT DETECTED Final   Influenza A NOT DETECTED NOT DETECTED Final   Influenza B NOT DETECTED NOT DETECTED Final   Parainfluenza Virus 1 NOT DETECTED NOT DETECTED Final   Parainfluenza Virus 2 NOT DETECTED NOT DETECTED Final   Parainfluenza Virus 3 NOT DETECTED NOT DETECTED Final   Parainfluenza Virus 4 NOT DETECTED NOT DETECTED Final   Respiratory Syncytial Virus NOT DETECTED NOT DETECTED Final   Bordetella pertussis NOT DETECTED NOT DETECTED Final   Chlamydophila pneumoniae NOT DETECTED NOT DETECTED Final   Mycoplasma pneumoniae NOT DETECTED NOT DETECTED Final    Comment: Performed at [facility]         Radiology Studies: No results found.    Scheduled Meds: . cefTRIAXone (ROCEPHIN)  IV  1 g Intravenous Q24H  . chlorhexidine gluconate (MEDLINE KIT)  15 mL Mouth Rinse BID  . clopidogrel  75 mg Oral Daily  . enoxaparin (LOVENOX) injection  40 mg Subcutaneous Q12H  . ipratropium-albuterol  3 mL Nebulization Q4H  . levothyroxine  157 mcg Intravenous Daily  . mouth rinse  15 mL Mouth Rinse QID  . metoprolol tartrate  50 mg Oral BID  . pantoprazole  40 mg Oral Q1200   Continuous Infusions: . dextrose 1,000 mL (08/07/16 1036)     LOS: 5 days    Time spent in minutes: [place], MD [facility] Pager: [email or web address] Password [email or web address] 08/08/2016, 10:33 AM

## 2016-08-09 LAB — BASIC METABOLIC PANEL
Anion gap: 9 (ref 5–15)
BUN: 16 mg/dL (ref 6–20)
CALCIUM: 7.7 mg/dL — AB (ref 8.9–10.3)
CHLORIDE: 105 mmol/L (ref 101–111)
CO2: 24 mmol/L (ref 22–32)
CREATININE: 0.79 mg/dL (ref 0.44–1.00)
GFR calc non Af Amer: >60 mL/min (ref >60)
Glucose, Bld: 103 mg/dL — ABNORMAL HIGH (ref 65–99)
Potassium: 3.9 mmol/L (ref 3.5–5.1)
SODIUM: 138 mmol/L (ref 135–145)

## 2016-08-09 LAB — CBC
HCT: 36.6 % (ref 36.0–46.0)
HEMOGLOBIN: 12.2 g/dL (ref 12.0–15.0)
MCH: 30.7 pg (ref 26.0–34.0)
MCHC: 33.3 g/dL (ref 30.0–36.0)
MCV: 92.2 fL (ref 78.0–100.0)
Platelets: 360 10*3/uL (ref 150–400)
RBC: 3.97 MIL/uL (ref 3.87–5.11)
RDW: 14.4 % (ref 11.5–15.5)
WBC: 12.9 10*3/uL — AB (ref 4.0–10.5)

## 2016-08-09 MED ORDER — IPRATROPIUM-ALBUTEROL 0.5-2.5 (3) MG/3ML IN SOLN
3.0000 mL | Freq: Four times a day (QID) | RESPIRATORY_TRACT | Status: DC
  Administered 2016-08-09: 3 mL via RESPIRATORY_TRACT
  Filled 2016-08-09 (×2): qty 3

## 2016-08-09 NOTE — Evaluation (Signed)
Physical Therapy Evaluation Patient Details Name: [name] MRN: [id number] DOB: December 03, 1955 Today's Date: 08/09/2016   History of Present Illness  60 y.o. female admitted with respiratory failure, encephalopathy, sepsis, pulmonary embolism (on Lovenox). PMH of COPD, chronic pain, MI 2014.   Clinical Impression  Pt admitted with above diagnosis. Pt currently with functional limitations due to the deficits listed below (see PT Problem List). Pt is confused (when asked her birthdate she replied "[birth year]"), and is unreliable historian. She does not follow commands despite stating that she will. She has difficulty with purposeful movements with BUE/LEs (she would not grip the RW, and was unable to feed herself her lunch).  Mod assist for stand pivot transfer. Prior functional level and level of assistance available at home unknown. 24* assistance recommended at present.  Pt will benefit from skilled PT to increase their independence and safety with mobility to allow discharge to the venue listed below.       Follow Up Recommendations SNF;Supervision/Assistance - 24 hour    Equipment Recommendations  Other (comment) (TBD)    Recommendations for Other Services OT consult     Precautions / Restrictions Precautions Precautions: Fall Precaution Comments: monitor O2 Restrictions Weight Bearing Restrictions: No      Mobility  Bed Mobility Overal bed mobility: Needs Assistance Bed Mobility: Supine to Sit     Supine to sit: Max assist     General bed mobility comments: assist to raise trunk and advance BLEs, pt 30%  Transfers Overall transfer level: Needs assistance Equipment used: Rolling walker (2 wheeled) Transfers: Sit to/from UGI CorporationStand;Stand Pivot Transfers Sit to Stand: Mod assist Stand pivot transfers: Mod assist       General transfer comment: attempted sit to stand with RW, pt would not place UEs on RW grips when standing despite verbal/manual cues. Then performed "bear  hug" SPT, with mod A to pivot.   Ambulation/Gait                Stairs            Wheelchair Mobility    Modified Rankin (Stroke Patients Only)       Balance Overall balance assessment: Needs assistance   Sitting balance-Leahy Scale: Poor   Postural control: Posterior lean   Standing balance-Leahy Scale: Zero Standing balance comment: posterior lean                             Pertinent Vitals/Pain Pain Assessment: Faces Faces Pain Scale: Hurts a little bit Pain Location: pt pushed my hand away when I gently touched R shin for bedmobility, pt confused, not able to verbalize location/intensity of pain Pain Intervention(s): Monitored during session    Home Living Family/patient expects to be discharged to:: Skilled nursing facility                      Prior Function           Comments: unknown, pt confused, unreliable historian     Hand Dominance        Extremity/Trunk Assessment   Upper Extremity Assessment: Difficult to assess due to impaired cognition (difficulty with purposeful movement BUEs (? weakness vs confusion vs ataxia))           Lower Extremity Assessment: Difficult to assess due to impaired cognition (difficulty with purposeful movement BLEs (? weakness vs confusion vs ataxia))      Cervical / Trunk Assessment: Kyphotic  Communication   Communication: Expressive difficulties (very low volume of speech)  Cognition Arousal/Alertness: Awake/alert Behavior During Therapy: Flat affect Overall Cognitive Status: No family/caregiver present to determine baseline cognitive functioning  When asked to extend her R knee for manual muscle testing, pt stated "sure" then made no attempt to move her leg. This occurred multiple times, pt stated she'd do something then did not.                     General Comments General comments (skin integrity, edema, etc.): pulse ox did not give a reading during nor after  activity, RN aware    Exercises     Assessment/Plan    PT Assessment Patient needs continued PT services  PT Problem List Decreased balance;Decreased activity tolerance;Decreased strength;Decreased mobility;Pain;Decreased cognition;Decreased coordination;Decreased safety awareness;Decreased knowledge of use of DME          PT Treatment Interventions DME instruction;Gait training;Functional mobility training;Balance training;Therapeutic exercise;Therapeutic activities;Patient/family education    PT Goals (Current goals can be found in the Care Plan section)  Acute Rehab PT Goals PT Goal Formulation: Patient unable to participate in goal setting Time For Goal Achievement: 10/26/15 Potential to Achieve Goals: Fair    Frequency Min 3X/week   Barriers to discharge   unknown level of assistance available at home    Co-evaluation               End of Session Equipment Utilized During Treatment: Gait belt;Oxygen Activity Tolerance: Patient tolerated treatment well Patient left: in chair;with call bell/phone within reach;with chair alarm set Nurse Communication: Mobility status         Time: [id number] PT Time Calculation (min) (ACUTE ONLY): 20 min   Charges:   PT Evaluation $PT Eval Moderate Complexity: 1 Procedure     PT G CodesTamala [name]:        [name] 08/09/2016, 2:27 PM [phone]

## 2016-08-09 NOTE — Progress Notes (Signed)
PROGRESS NOTE    [name]  GLO:[id number] DOB: 04-12-1956 DOA: 08/03/2016  PCP: Limmie Patricia, MD   Brief Narrative:  60 y/o female with PMH of chronic pain on narcotics, NSTEMI (2014), NSVT, hypothyroid who developed a cough a few days prior to admission. Her friend found her laying on the couch in her stool, not very responsive- would open eyes and follow some commands in the ER at [facility]. Not much history was obtained as she was soon intubated. She was found to be hypoxic and initially placed on a non-rebreather but, as mentioned, had to be intubated while in the ER. She was transferred to [facility]. CXR revealed diffuse b/l infiltrates. U strep antigen later found to be positive. CT on 11/30 later showed a near occlusive segmental PE in LUL as well and Heparin was started. She was extubated on 12/1.   Subjective: She has no complaints.    Assessment & Plan:   Active Problems:   Community acquired pneumonia,   Sepsis  - strep pneumo antigen +, multifocal infiltrates, Procalcitonin 3.39, WBC 12, HR > 100. RR> 20, Temp 102.2 on 12/1  - narrowed to Rocephin  Acute respiratory failure with hypoxia - currently on 8 L O2 via HFNC (A) Pneumonia- as above (B) PE - Lovenox for now - LE duplex negative -  ECHO was a poor study- Left ventricle: The cavity size was normal. Systolic function was   vigorous. The estimated ejection fraction was in the range of 65%   to 70%.    Acute encephalopathy - persisting- ? Due to infection vs ICU stay     Hypokalemia -   replace as needed  Hypernatremia - resolved - d/c D5W at 40 cc/hr   Fluid overloaded - Lasix 40 mg IV daily x 2 doses- weight 34.5 >> 43.8 >> 42.6    Chronic pain syndrome - holding Fentanyl patch 100 mcg, MSIR 20 mg Q 4 PRN and Feleril 5 mg TID PRN     Hypothyroidism   - Synthroid IV    Protein-calorie malnutrition, severe - start supplements when able to tolerate orals    Tobacco use  disorder  CAD with stents - Plavix  HTN - started on Metoprolol 50 BID -PRN Hydralazine, LAsix for fluid overload - BP improving now  DVT prophylaxis: full dose Lovenox- switch to Eliquis today Code Status: DNR Family Communication:  Disposition Plan: transfer to med/surg Consultants:   PCCM Procedures:   Intubation  Antimicrobials:  Anti-infectives    Start     Dose/Rate Route Frequency Ordered Stop   08/06/16 1400  cefTRIAXone (ROCEPHIN) 1 g in dextrose 5 % 50 mL IVPB     1 g 100 mL/hr over 30 Minutes Intravenous Every 24 hours 08/06/16 0844 08/11/16 1359   08/05/16 1400  cefTRIAXone (ROCEPHIN) 2 g in dextrose 5 % 50 mL IVPB  Status:  Discontinued     2 g 100 mL/hr over 30 Minutes Intravenous Every 24 hours 08/05/16 1053 08/06/16 0844   08/04/16 1800  vancomycin (VANCOCIN) IVPB 750 mg/150 ml premix  Status:  Discontinued     750 mg 150 mL/hr over 60 Minutes Intravenous Every 24 hours 08/04/16 0730 08/05/16 1053   08/04/16 1100  doxycycline (VIBRAMYCIN) 100 mg in dextrose 5 % 250 mL IVPB  Status:  Discontinued     100 mg 125 mL/hr over 120 Minutes Intravenous Every 12 hours 08/04/16 1049 08/04/16 1056   08/04/16 0100  piperacillin-tazobactam (ZOSYN) IVPB 3.375 g  Status:  Discontinued     3.375 g 12.5 mL/hr over 240 Minutes Intravenous Every 8 hours 08/03/16 1604 08/05/16 1053   08/03/16 2315  doxycycline (VIBRAMYCIN) 100 mg in dextrose 5 % 250 mL IVPB  Status:  Discontinued     100 mg 125 mL/hr over 120 Minutes Intravenous 2 times daily 08/03/16 2312 08/04/16 1049   08/03/16 1600  piperacillin-tazobactam (ZOSYN) IVPB 3.375 g     3.375 g 100 mL/hr over 30 Minutes Intravenous  Once 08/03/16 1557 08/03/16 1705   08/03/16 1600  vancomycin (VANCOCIN) IVPB 1000 mg/200 mL premix     1,000 mg 200 mL/hr over 60 Minutes Intravenous  Once 08/03/16 1557 08/03/16 2015       Objective: Vitals:   08/09/16 0800 08/09/16 0913 08/09/16 1154 08/09/16 1155  BP: (!) 159/71 (!)  146/85    Pulse:  (!) 111    Resp: (!) 22 (!) 25    Temp: 99.7 F (37.6 C) 98.7 F (37.1 C)    TempSrc: Core (Comment) Axillary    SpO2: 95% 90% (!) 81% (!) 86%  Weight:      Height:        Intake/Output Summary (Last 24 hours) at 08/09/16 1226 Last data filed at 08/09/16 0800  Gross per 24 hour  Intake              350 ml  Output             1835 ml  Net            -1485 ml   Filed Weights   08/07/16 0500 08/08/16 0418 08/09/16 0500  Weight: 45.6 kg (100 lb 8.5 oz) 43.8 kg (96 lb 9 oz) 42.6 kg (93 lb 14.7 oz)    Examination: General exam: Appears comfortable  HEENT: PERRLA, oral mucosa moist, no sclera icterus or thrush Respiratory system: Clear to auscultation. Respiratory effort normal. Cardiovascular system: S1 & S2 heard, RRR.  No murmurs  Gastrointestinal system: Abdomen soft, non-tender, nondistended. Normal bowel sound. No organomegaly Central nervous system: Alert - confused to time and place-  No focal neurological deficits. Extremities: No cyanosis, clubbing or edema Skin: No rashes or ulcers Psychiatry:  Mood & affect appropriate.     Data Reviewed: I have personally reviewed following labs and imaging studies  CBC:  Recent Labs Lab 08/03/16 1549  08/05/16 0322 08/06/16 0325 08/07/16 0330 08/08/16 0343 08/09/16 0335  WBC 9.0  < > 12.7* 8.4 11.7* 13.2* 12.9*  NEUTROABS 8.0*  --   --   --   --   --   --   HGB 13.5  < > 11.5* 10.3* 11.7* 12.4 12.2  HCT 40.3  < > 35.5* 31.6* 35.6* 37.6 36.6  MCV 92.0  < > 95.2 97.8 93.4 92.6 92.2  PLT 202  < > 186 145* 219 309 360  < > = values in this interval not displayed. Basic Metabolic Panel:  Recent Labs Lab 08/04/16 0119  08/05/16 0322 08/06/16 0325 08/07/16 0330 08/08/16 0343 08/09/16 0335  NA  --   < > 146* 149* 143 139 138  K  --   < > 3.3* 4.1 3.3* 4.6 3.9  CL  --   < > 108 117* 110 109 105  CO2  --   < > _0 GLUCOSE  --   < > 149* 167* 100* 105* 103*  BUN  --   < > 38* 22* 16 15  16  CREATININE  --   < > 1.07* 0.67 0.59 0.65 0.79  CALCIUM  --   < > 8.0* 7.4* 7.6* 7.9* 7.7*  MG 2.2  --  2.2 1.9 1.7 1.7  --   PHOS 4.3  --  3.6 2.6 3.3 2.8  --   < > = values in this interval not displayed. GFR: Estimated Creatinine Clearance (by C-G formula based on SCr of 0.79 mg/dL) Female: 50.9 mL/min Female: 59.9 mL/min Liver Function Tests:  Recent Labs Lab 08/03/16 1549 08/07/16 0330  AST 19 34  ALT 10* 18  ALKPHOS 57 47  BILITOT 1.7* 1.1  PROT 7.7 5.6*  ALBUMIN 2.8* 2.2*   No results for input(s): LIPASE, AMYLASE in the last 168 hours.  Recent Labs Lab 08/03/16 1611  AMMONIA 13   Coagulation Profile:  Recent Labs Lab 08/05/16 0322  INR 1.06   Cardiac Enzymes:  Recent Labs Lab 08/04/16 0119 08/04/16 0656 08/05/16 0322  TROPONINI <0.03 <0.03 <0.03   BNP (last 3 results) No results for input(s): PROBNP in the last 8760 hours. HbA1C: No results for input(s): HGBA1C in the last 72 hours. CBG:  Recent Labs Lab 08/06/16 1554 08/06/16 2040 08/06/16 2303 08/07/16 0325 08/07/16 0751  GLUCAP 101* 108* 139* 103* 116*   Lipid Profile:  Recent Labs  08/06/16 1737  TRIG 112   Thyroid Function Tests: No results for input(s): TSH, T4TOTAL, FREET4, T3FREE, THYROIDAB in the last 72 hours. Anemia Panel: No results for input(s): VITAMINB12, FOLATE, FERRITIN, TIBC, IRON, RETICCTPCT in the last 72 hours. Urine analysis:    Component Value Date/Time   COLORURINE YELLOW 08/03/2016 1700   APPEARANCEUR CLEAR 08/03/2016 1700   LABSPEC 1.010 08/03/2016 1700   PHURINE 6.0 08/03/2016 1700   GLUCOSEU NEGATIVE 08/03/2016 1700   HGBUR LARGE (A) 08/03/2016 1700   BILIRUBINUR SMALL (A) 08/03/2016 1700   KETONESUR 15 (A) 08/03/2016 1700   PROTEINUR 100 (A) 08/03/2016 1700   UROBILINOGEN 0.2 11/14/2012 2155   NITRITE NEGATIVE 08/03/2016 1700   LEUKOCYTESUR NEGATIVE 08/03/2016 1700   Sepsis Labs: _0 (procalcitonin:4,lacticidven:4) ) Recent Results  (from the past 240 hour(s))  Blood Culture (routine x 2)     Status: None (Preliminary result)   Collection Time: 08/03/16  4:01 PM  Result Value Ref Range Status   Specimen Description BLOOD LEFT ANTECUBITAL  Final   Special Requests BOTTLES DRAWN AEROBIC AND ANAEROBIC 8CC  Final   Culture NO GROWTH 3 DAYS  Final   Report Status PENDING  Incomplete  Blood Culture (routine x 2)     Status: None (Preliminary result)   Collection Time: 08/03/16  4:05 PM  Result Value Ref Range Status   Specimen Description BLOOD RIGHT ANTECUBITAL  Final   Special Requests   Final    BOTTLES DRAWN AEROBIC AND ANAEROBIC AEB=7CC ANA=5CC   Culture NO GROWTH 3 DAYS  Final   Report Status PENDING  Incomplete  Urine culture     Status: None   Collection Time: 08/03/16  5:00 PM  Result Value Ref Range Status   Specimen Description URINE, CATHETERIZED  Final   Special Requests NONE  Final   Culture NO GROWTH Performed at [facility]   Final   Report Status 08/05/2016 FINAL  Final  Culture, respiratory (NON-Expectorated)     Status: None   Collection Time: 08/03/16 11:32 PM  Result Value Ref Range Status   Specimen Description TRACHEAL ASPIRATE  Final   Special Requests Normal  Final  Gram Stain   Final    FEW WBC PRESENT, PREDOMINANTLY PMN RARE GRAM POSITIVE COCCI IN PAIRS RARE YEAST    Culture   Final    Consistent with normal respiratory flora. Performed at [facility]    Report Status 08/06/2016 FINAL  Final  MRSA PCR Screening     Status: None   Collection Time: 08/04/16 12:34 AM  Result Value Ref Range Status   MRSA by PCR NEGATIVE NEGATIVE Final    Comment:        The GeneXpert MRSA Assay (FDA approved for NASAL specimens only), is one component of a comprehensive MRSA colonization surveillance program. It is not intended to diagnose MRSA infection nor to guide or monitor treatment for MRSA infections.   Respiratory Panel by PCR     Status: None   Collection Time:  08/04/16 12:34 AM  Result Value Ref Range Status   Adenovirus NOT DETECTED NOT DETECTED Final   Coronavirus 229E NOT DETECTED NOT DETECTED Final   Coronavirus HKU1 NOT DETECTED NOT DETECTED Final   Coronavirus NL63 NOT DETECTED NOT DETECTED Final   Coronavirus OC43 NOT DETECTED NOT DETECTED Final   Metapneumovirus NOT DETECTED NOT DETECTED Final   Rhinovirus / Enterovirus NOT DETECTED NOT DETECTED Final   Influenza A NOT DETECTED NOT DETECTED Final   Influenza B NOT DETECTED NOT DETECTED Final   Parainfluenza Virus 1 NOT DETECTED NOT DETECTED Final   Parainfluenza Virus 2 NOT DETECTED NOT DETECTED Final   Parainfluenza Virus 3 NOT DETECTED NOT DETECTED Final   Parainfluenza Virus 4 NOT DETECTED NOT DETECTED Final   Respiratory Syncytial Virus NOT DETECTED NOT DETECTED Final   Bordetella pertussis NOT DETECTED NOT DETECTED Final   Chlamydophila pneumoniae NOT DETECTED NOT DETECTED Final   Mycoplasma pneumoniae NOT DETECTED NOT DETECTED Final    Comment: Performed at [facility]         Radiology Studies: No results found.    Scheduled Meds: . cefTRIAXone (ROCEPHIN)  IV  1 g Intravenous Q24H  . chlorhexidine gluconate (MEDLINE KIT)  15 mL Mouth Rinse BID  . clopidogrel  75 mg Oral Daily  . enoxaparin (LOVENOX) injection  40 mg Subcutaneous Q12H  . feeding supplement (ENSURE ENLIVE)  237 mL Oral BID BM  . ipratropium-albuterol  3 mL Nebulization Q4H  . levothyroxine  157 mcg Intravenous Daily  . metoprolol tartrate  50 mg Oral BID  . pantoprazole  40 mg Oral Q1200   Continuous Infusions:    LOS: 6 days    Time spent in minutes: 3    [place], MD [facility] Pager: [email or web address] Password [email or web address] 08/09/2016, 12:26 PM

## 2016-08-10 LAB — CBC
HCT: 34.5 % — ABNORMAL LOW (ref 36.0–46.0)
Hemoglobin: 11.5 g/dL — ABNORMAL LOW (ref 12.0–15.0)
MCH: 30.8 pg (ref 26.0–34.0)
MCHC: 33.3 g/dL (ref 30.0–36.0)
MCV: 92.5 fL (ref 78.0–100.0)
Platelets: 377 10*3/uL (ref 150–400)
RBC: 3.73 MIL/uL — ABNORMAL LOW (ref 3.87–5.11)
RDW: 14.4 % (ref 11.5–15.5)
WBC: 11.4 10*3/uL — ABNORMAL HIGH (ref 4.0–10.5)

## 2016-08-10 MED ORDER — IPRATROPIUM-ALBUTEROL 0.5-2.5 (3) MG/3ML IN SOLN
3.0000 mL | Freq: Three times a day (TID) | RESPIRATORY_TRACT | Status: DC
  Filled 2016-08-10: qty 3

## 2016-08-10 MED ORDER — METOPROLOL TARTRATE 50 MG PO TABS: 50.0000 mg | ORAL_TABLET | Freq: Two times a day (BID) | ORAL | Status: AC

## 2016-08-10 MED ORDER — LEVOTHYROXINE SODIUM 100 MCG IV SOLR
1100.0000 ug | INTRAVENOUS | Status: DC
  Filled 2016-08-10 (×3): qty 55

## 2016-08-10 MED ORDER — ENSURE ENLIVE PO LIQD: 237.0000 mL | Freq: Two times a day (BID) | ORAL | 12 refills | Status: AC

## 2016-08-10 MED ORDER — RIVAROXABAN 15 MG PO TABS: 15.0000 mg | ORAL_TABLET | Freq: Two times a day (BID) | ORAL | Status: AC

## 2016-08-10 MED ORDER — LEVOTHYROXINE SODIUM 100 MCG IV SOLR: 1100.0000 ug | INTRAVENOUS | Status: DC

## 2016-08-10 MED ORDER — LEVOTHYROXINE SODIUM 100 MCG IV SOLR
1000.0000 ug | INTRAVENOUS | Status: DC
  Filled 2016-08-10: qty 50

## 2016-08-10 NOTE — Progress Notes (Signed)
OT Cancellation Note  Patient Details Name: [name] MRN: [id number] DOB: April 27, 1956   Cancelled Treatment:    Reason Eval/Treat Not Completed: Other (comment). Noted plan is for d/c to snf today.  Will defer OT evaluation to that venue; however, if pt remains here, we will check back.  Deylan Canterbury 08/10/2016, 2:54 PM  [name], OTR/L [phone] 08/10/2016

## 2016-08-10 NOTE — Progress Notes (Addendum)
Multiple attempts to try to give patient her medications ordered. Even tried while next of kin, [place], which is the patient's niece was visiting the patient to give while she was in the room. Patient continued to refuse. MD notified.

## 2016-08-10 NOTE — Care Management Important Message (Signed)
Important Message  Patient Details  Name: [name] MRN: [id number] Date of Birth: 1955/09/14   Medicare Important Message Given:  Yes    Haskell FlirtJamison, Burnard Enis 08/10/2016, 10:20 AMImportant Message  Patient Details  Name: [name] MRN: [id number] Date of Birth: 1955/09/14   Medicare Important Message Given:  Yes    Haskell FlirtJamison, Bartlomiej Jenkinson 08/10/2016, 10:20 AM

## 2016-08-10 NOTE — Clinical Social Work Placement (Addendum)
   CLINICAL SOCIAL WORK PLACEMENT  NOTE  Date:  08/10/2016  Patient Details  Name: [name] MRN: [id number] Date of Birth: 01/22/1956  Clinical Social Work is seeking post-discharge placement for this patient at the Skilled  Nursing Facility level of care (*CSW will initial, date and re-position this form in  chart as items are completed):  Yes   Patient/family provided with [facility] Clinical Social Work Department's list of facilities offering this level of care within the geographic area requested by the patient (or if unable, by the patient's family).  Yes   Patient/family informed of their freedom to choose among providers that offer the needed level of care, that participate in Medicare, Medicaid or managed care program needed by the patient, have an available bed and are willing to accept the patient.  Yes   Patient/family informed of [facility] ownership interest [facility], as well as of the fact that they are under no obligation to receive care at these facilities.  PASRR submitted to EDS on 08/10/16     PASRR number received on 08/10/16     Existing PASRR number confirmed on       FL2 transmitted to all facilities in geographic area requested by pt/family on       FL2 transmitted to all facilities within larger geographic area on 08/10/16     Patient informed that his/her managed care company has contracts with or will negotiate with certain facilities, including the following:     [facility]   Yes   Patient/family informed of bed offers received.  [facility]   Physician recommends and patient [facility]    [facility]   Patient to be transferred to   on 08/10/16.  Patient to be transferred to facility by [facility]     Patient family notified on 08/10/16 of transfer.  Name of family member notified:  G[place]Brittany Starns:[phone]     PHYSICIAN Please sign DNR, Please  sign FL2     Additional Comment:    _______________________________________________ Clearance CootsNicole A Almando Brawley, LCSW 08/10/2016, 12:04 PM

## 2016-08-10 NOTE — Discharge Summary (Addendum)
Physician Discharge Summary  Hermina StaggersMartha A [name] ZOX:[id number] DOB: 18-Feb-1956 DOA: 08/03/2016  PCP: Junious SilkALTHEIMER,MICHAEL D, MD  Admit date: 08/03/2016 Discharge date: 08/10/2016  Admitted From: home  Disposition:  SNF   Recommendations for Outpatient Follow-up:  1. F/u on encephalopathy/ ICU related delirium 2. Being discharged to SNF 3. Reduce Xarelto dose after 21 days 4. Should see PCP in 1-2 wks 5. Will need another dose of Synthroid in 1 wk 6. Cont O2 to keep pulse ox > 90% - on 1-2 L here    Discharge Condition:  stable   CODE STATUS:  DNR   Diet recommendation:  Regular diet Consultations:      Discharge Diagnoses:  Principal Problem:   Acute respiratory failure with hypoxia (HCC) Active Problems:   Community acquired pneumonia   Sepsis (HCC)   Hypokalemia   Chronic pain syndrome   Hypothyroidism (acquired)   Acute encephalopathy   Protein-calorie malnutrition, severe (HCC)   Pulmonary embolus (HCC)   Tobacco use disorder    Subjective: Confused, tells me to leave the room. No complaints.   Brief Summary: 60 y/o female with PMH of chronic pain on narcotics, NSTEMI (2014), NSVT, hypothyroid who developed a cough a few days prior to admission. Her friend found her laying on the couch in her stool, not very responsive- would open eyes and follow some commands in the ER at [facility]. Not much history was obtained as she was soon intubated. She was found to be hypoxic and initially placed on a non-rebreather but, as mentioned, had to be intubated while in the ER. [facility] CXR revealed diffuse b/l infiltrates. U strep antigen later found to be positive. CT on 11/30 later showed a near occlusive segmental PE in LUL as well and Heparin was started. She was extubated on 12/1.   Hospital Course:  Active Problems:   Community acquired pneumonia,   Sepsis  - strep pneumo antigen +, multifocal infiltrates, Procalcitonin 3.39, WBC 12, HR > 100.  RR> 20, Temp 102.2 on 12/1  - narrowed to Rocephin- has received 1 wk  Acute respiratory failure with hypoxia - currently on 2 L O2 via HFNC (A) Pneumonia- as above (B) PE - Lovenox given this AM- to be started on Xarelto this evening at SNF - LE duplex negative -  ECHO was a poor study- Left ventricle: The cavity size was normal. Systolic function was vigorous. The estimated ejection fraction was in the range of 65% to 70%.    Acute encephalopathy - persisting-  Suspect due to infection vs ICU stay  - cont to follow for improvement- no further work up done as of yet.    Hypokalemia -   replace as needed  Hypernatremia - resolved - d/c D5W at 40 cc/hr   Fluid overloaded - due to resuscitative effort early on on the admission. Lasix 40 mg IV daily x 2 doses- weight 34.5 >> 43.8 >> 42.6    Chronic pain syndrome - holding Fentanyl patch 100 mcg, MSIR 20 mg Q 4 PRN and Fexeril 5 mg TID PRN- not in pain and mostly sleeping     Hypothyroidism   - Synthroid was given at 1,100 mcg IV weekly at the infusion center ? Poor absorption - will give her a full dose today prior to d/c- we have been giving smaller daily IV doses     Protein-calorie malnutrition, severe - started supplements      Tobacco use disorder  CAD with stents -  Plavix- in addition to Xarelto  HTN - started on Metoprolol 50 BID  But BP was still quite high- given 2 doses of IV Lasix for fluid overload and subsequently BP improved   Discharge Instructions  Discharge Instructions    Discharge instructions    Complete by:  As directed    Regular diet   Increase activity slowly    Complete by:  As directed    Increase activity slowly    Complete by:  As directed        Medication List    STOP taking these medications   cyclobenzaprine 5 MG tablet Commonly known as:  FLEXERIL   diazepam 10 MG tablet Commonly known as:  VALIUM   fentaNYL 100 MCG/HR Commonly known as:  DURAGESIC - dosed  mcg/hr   morphine 30 MG tablet Commonly known as:  MSIR   pantoprazole 40 MG tablet Commonly known as:  PROTONIX   potassium chloride 20 MEQ/15ML (10%) solution     TAKE these medications   clopidogrel 75 MG tablet Commonly known as:  PLAVIX Take 1 tablet (75 mg total) by mouth daily. For treatment of your heart attack. Stop this medicine if you have rectal bleeding or black stools.   feeding supplement (ENSURE ENLIVE) Liqd Take 237 mLs by mouth 2 (two) times daily between meals.   levothyroxine 100 MCG Solr injection Commonly known as:  SYNTHROID, LEVOTHROID At previous dose per Dr. Leslie Dales. What changed:  how much to take  how to take this  when to take this  additional instructions   metoprolol 50 MG tablet Commonly known as:  LOPRESSOR Take 1 tablet (50 mg total) by mouth 2 (two) times daily.   Rivaroxaban 15 MG Tabs tablet Commonly known as:  XARELTO Take 1 tablet (15 mg total) by mouth 2 (two) times daily with a meal.       Allergies  Allergen Reactions  . Aspirin Rash  . Calcium-Containing Compounds Rash  . Codeine Rash  . Tagamet [Cimetidine] Rash     Procedures/Studies:   Dg Chest 1 View  Result Date: 08/03/2016 CLINICAL DATA:  Post intubation EXAM: CHEST 1 VIEW COMPARISON:  08/03/2016 FINDINGS: Endotracheal tube just below the carina in the right main bronchus. Recommend withdrawal 4 cm. Port-A-Cath tip in the SVC Chronic lung disease. Diffuse bilateral airspace disease which has progressed on the right. Probable pneumonia. No significant effusion. IMPRESSION: Endotracheal tube in right main bronchus.  Recommend withdrawal 4 cm Progression of infiltrate on the right most likely due to pneumonia. Diffuse bilateral airspace disease. These results will be called to the ordering clinician or representative by the Radiologist Assistant, and communication documented in the PACS or zVision Dashboard. Electronically Signed   By: Marlan Palau M.D.   On:  08/03/2016 18:26   Ct Angio Chest Pe W Or Wo Contrast  Result Date: 08/05/2016 CLINICAL DATA:  60 year old female with history of acute hypoxic respiratory failure and acute encephalopathy. Community acquired pneumonia. EXAM: CT ANGIOGRAPHY CHEST WITH CONTRAST TECHNIQUE: Multidetector CT imaging of the chest was performed using the standard protocol during bolus administration of intravenous contrast. Multiplanar CT image reconstructions and MIPs were obtained to evaluate the vascular anatomy. CONTRAST:  75 mL of Isovue 370. COMPARISON:  No priors. FINDINGS: Cardiovascular: In a segmental sized branch of the left upper lobe extending into subsegmental sized branches there is a filling defect which appears nearly completely occlusive, compatible with pulmonary embolism. No other larger central or lobar sized filling defects are  noted. Heart size is normal. Small amount of pericardial fluid and/or thickening, unlikely to be of any hemodynamic significance at this time. No associated pericardial calcification. There is aortic atherosclerosis, as well as atherosclerosis of the great vessels of the mediastinum and the coronary arteries, including calcified atherosclerotic plaque in the left anterior descending coronary artery. Right subclavian single-lumen porta cath with tip terminating in the proximal superior vena cava. Mediastinum/Nodes: Multiple borderline enlarged mediastinal and hilar lymph nodes are noted. No pathologically enlarged mediastinal or hilar lymph nodes are noted. Esophagus is unremarkable in appearance. Nasogastric tube extends into the stomach. No axillary lymphadenopathy. Lungs/Pleura: There is widespread bronchial wall thickening, patchy areas of thickening of the peribronchovascular interstitium, patchy areas of mild cylindrical bronchiectasis, and extensive peribronchovascular airspace consolidation, compatible with multilobar bronchopneumonia. Involvement is most severe in the lower lobes  of the lungs bilaterally, but all lobes are involved. Moderate centrilobular and paraseptal emphysema. No pleural effusions. Upper Abdomen: Nasogastric tube terminating in the stomach. Aortic atherosclerosis. Status post cholecystectomy. Musculoskeletal: There are no aggressive appearing lytic or blastic lesions noted in the visualized portions of the skeleton. Review of the MIP images confirms the above findings. IMPRESSION: 1. Study is positive for segmental sized pulmonary embolus to the left upper lobe with some extension into small subsegmental sized vessels. This appears nearly completely occlusive. 2. Severe multilobar bronchopneumonia, as detailed above. 3. Aortic atherosclerosis, in addition to left anterior descending coronary artery disease. Please note that although the presence of coronary artery calcium documents the presence of coronary artery disease, the severity of this disease and any potential stenosis cannot be assessed on this non-gated CT examination. Assessment for potential risk factor modification, dietary therapy or pharmacologic therapy may be warranted, if clinically indicated. 4. Additional incidental findings, as above. These results will be called to the ordering clinician or representative by the Radiologist Assistant, and communication documented in the PACS or zVision Dashboard. Electronically Signed   By: Trudie Reed M.D.   On: 08/05/2016 16:30   Dg Chest Port 1 View  Result Date: 08/06/2016 CLINICAL DATA:  Pneumonia. EXAM: PORTABLE CHEST 1 VIEW COMPARISON:  Of an 30 17. FINDINGS: Support tubes and lines appear stable.  No pneumothorax. Patchy pulmonary opacities better visualized on CT, stable. Marked hyperinflation consistent with COPD. IMPRESSION: Stable chest. Electronically Signed   By: Elsie Stain M.D.   On: 08/06/2016 06:44   Dg Chest Port 1 View  Result Date: 08/05/2016 CLINICAL DATA:  Pneumonia.  Endotracheal tube position EXAM: PORTABLE CHEST 1 VIEW  COMPARISON:  08/03/2016 FINDINGS: Endotracheal tube in satisfactory position. Port-A-Cath in the right innominate vein unchanged. Gastric tube enters the stomach. COPD with hyperinflation. Bibasilar airspace disease has improved in the interval. Negative for heart failure or effusion IMPRESSION: Endotracheal tube remains in satisfactory position COPD with improved bibasilar airspace disease. Electronically Signed   By: Marlan Palau M.D.   On: 08/05/2016 07:04   Portable Chest Xray  Result Date: 08/04/2016 CLINICAL DATA:  Endotracheal tube repositioning.  Initial encounter. EXAM: PORTABLE CHEST 1 VIEW COMPARISON:  Chest radiograph performed earlier today at 6:06 p.m. FINDINGS: The patient's endotracheal tube is seen ending 4-5 cm above the carina. An enteric tube is noted extending below the diaphragm. A right-sided chest port is noted ending about the proximal to mid SVC. The lungs are well-aerated. Patchy bilateral airspace opacification remains concerning for pneumonia, similar in appearance to the prior study. No pleural effusion or pneumothorax is seen. The heart remains normal in size. No  acute osseous abnormalities are identified. IMPRESSION: 1. Endotracheal tube seen ending 4-5 cm above the carina. 2. Patchy bilateral airspace opacification remains concerning for pneumonia, similar in appearance to the prior study. Electronically Signed   By: Roanna Raider M.D.   On: 08/04/2016 00:03   Dg Chest Port 1 View  Result Date: 08/03/2016 CLINICAL DATA:  Altered mental status.  Hypoxia. EXAM: PORTABLE CHEST 1 VIEW COMPARISON:  11/16/2012 FINDINGS: COPD with pulmonary hyperinflation. Diffuse bilateral airspace disease which may be due to pneumonia or edema. This was not present previously. No effusion. Heart size normal. Port-A-Cath tip in the proximal SVC unchanged. IMPRESSION: COPD with diffuse bilateral airspace disease. Diffuse pneumonia versus heart failure however the heart is not enlarged.  Electronically Signed   By: Marlan Palau M.D.   On: 08/03/2016 16:29       Discharge Exam: Vitals:   08/10/16 0611 08/10/16 0900  BP: (!) 156/73   Pulse: 82 83  Resp: 19 16  Temp: 99 F (37.2 C)    Vitals:   08/09/16 2122 08/09/16 2145 08/10/16 0611 08/10/16 0900  BP: 120/69  (!) 156/73   Pulse: 93  82 83  Resp: 20  19 16   Temp: 98.4 F (36.9 C)  99 F (37.2 C)   TempSrc: Oral  Oral   SpO2: 91% 94% 95% 91%  Weight:      Height:        General: Pt is alert, awake, not in acute distress Cardiovascular: RRR, S1/S2 +, no rubs, no gallops Respiratory: CTA bilaterally, no wheezing, no rhonchi Abdominal: Soft, NT, ND, bowel sounds + Extremities: no edema, no cyanosis    The results of significant diagnostics from this hospitalization (including imaging, microbiology, ancillary and laboratory) are listed below for reference.     Microbiology: Recent Results (from the past 240 hour(s))  Blood Culture (routine x 2)     Status: None (Preliminary result)   Collection Time: 08/03/16  4:01 PM  Result Value Ref Range Status   Specimen Description BLOOD LEFT ANTECUBITAL  Final   Special Requests BOTTLES DRAWN AEROBIC AND ANAEROBIC 8CC  Final   Culture NO GROWTH 3 DAYS  Final   Report Status PENDING  Incomplete  Blood Culture (routine x 2)     Status: None (Preliminary result)   Collection Time: 08/03/16  4:05 PM  Result Value Ref Range Status   Specimen Description BLOOD RIGHT ANTECUBITAL  Final   Special Requests   Final    BOTTLES DRAWN AEROBIC AND ANAEROBIC AEB=7CC ANA=5CC   Culture NO GROWTH 3 DAYS  Final   Report Status PENDING  Incomplete  Urine culture     Status: None   Collection Time: 08/03/16  5:00 PM  Result Value Ref Range Status   Specimen Description URINE, CATHETERIZED  Final   Special Requests NONE  Final   Culture NO GROWTH Performed at [facility]   Final   Report Status 08/05/2016 FINAL  Final  Culture, respiratory (NON-Expectorated)      Status: None   Collection Time: 08/03/16 11:32 PM  Result Value Ref Range Status   Specimen Description TRACHEAL ASPIRATE  Final   Special Requests Normal  Final   Gram Stain   Final    FEW WBC PRESENT, PREDOMINANTLY PMN RARE GRAM POSITIVE COCCI IN PAIRS RARE YEAST    Culture   Final    Consistent with normal respiratory flora. Performed at [facility]    Report Status 08/06/2016 FINAL  Final  MRSA PCR Screening     Status: None   Collection Time: 08/04/16 12:34 AM  Result Value Ref Range Status   MRSA by PCR NEGATIVE NEGATIVE Final    Comment:        The GeneXpert MRSA Assay (FDA approved for NASAL specimens only), is one component of a comprehensive MRSA colonization surveillance program. It is not intended to diagnose MRSA infection nor to guide or monitor treatment for MRSA infections.   Respiratory Panel by PCR     Status: None   Collection Time: 08/04/16 12:34 AM  Result Value Ref Range Status   Adenovirus NOT DETECTED NOT DETECTED Final   Coronavirus 229E NOT DETECTED NOT DETECTED Final   Coronavirus HKU1 NOT DETECTED NOT DETECTED Final   Coronavirus NL63 NOT DETECTED NOT DETECTED Final   Coronavirus OC43 NOT DETECTED NOT DETECTED Final   Metapneumovirus NOT DETECTED NOT DETECTED Final   Rhinovirus / Enterovirus NOT DETECTED NOT DETECTED Final   Influenza A NOT DETECTED NOT DETECTED Final   Influenza B NOT DETECTED NOT DETECTED Final   Parainfluenza Virus 1 NOT DETECTED NOT DETECTED Final   Parainfluenza Virus 2 NOT DETECTED NOT DETECTED Final   Parainfluenza Virus 3 NOT DETECTED NOT DETECTED Final   Parainfluenza Virus 4 NOT DETECTED NOT DETECTED Final   Respiratory Syncytial Virus NOT DETECTED NOT DETECTED Final   Bordetella pertussis NOT DETECTED NOT DETECTED Final   Chlamydophila pneumoniae NOT DETECTED NOT DETECTED Final   Mycoplasma pneumoniae NOT DETECTED NOT DETECTED Final    Comment: Performed at [facility]     Labs: BNP (last 3  results)  Recent Labs  08/03/16 1611  BNP 166.0*   Basic Metabolic Panel:  Recent Labs Lab 08/04/16 0119  08/05/16 0322 08/06/16 0325 08/07/16 0330 08/08/16 0343 08/09/16 0335  NA  --   < > 146* 149* 143 139 138  K  --   < > 3.3* 4.1 3.3* 4.6 3.9  CL  --   < > 108 117* 110 109 105  CO2  --   < > 27 25 25 22 24   GLUCOSE  --   < > 149* 167* 100* 105* 103*  BUN  --   < > 38* 22* 16 15 16   CREATININE  --   < > 1.07* 0.67 0.59 0.65 0.79  CALCIUM  --   < > 8.0* 7.4* 7.6* 7.9* 7.7*  MG 2.2  --  2.2 1.9 1.7 1.7  --   PHOS 4.3  --  3.6 2.6 3.3 2.8  --   < > = values in this interval not displayed. Liver Function Tests:  Recent Labs Lab 08/03/16 1549 08/07/16 0330  AST 19 34  ALT 10* 18  ALKPHOS 57 47  BILITOT 1.7* 1.1  PROT 7.7 5.6*  ALBUMIN 2.8* 2.2*   No results for input(s): LIPASE, AMYLASE in the last 168 hours.  Recent Labs Lab 08/03/16 1611  AMMONIA 13   CBC:  Recent Labs Lab 08/03/16 1549  08/06/16 0325 08/07/16 0330 08/08/16 0343 08/09/16 0335 08/10/16 0558  WBC 9.0  < > 8.4 11.7* 13.2* 12.9* 11.4*  NEUTROABS 8.0*  --   --   --   --   --   --   HGB 13.5  < > 10.3* 11.7* 12.4 12.2 11.5*  HCT 40.3  < > 31.6* 35.6* 37.6 36.6 34.5*  MCV 92.0  < > 97.8 93.4 92.6 92.2 92.5  PLT 202  < >  145* 219 309 360 377  < > = values in this interval not displayed. Cardiac Enzymes:  Recent Labs Lab 08/04/16 0119 08/04/16 0656 08/05/16 0322  TROPONINI <0.03 <0.03 <0.03   BNP: Invalid input(s): POCBNP CBG:  Recent Labs Lab 08/06/16 1554 08/06/16 2040 08/06/16 2303 08/07/16 0325 08/07/16 0751  GLUCAP 101* 108* 139* 103* 116*   D-Dimer No results for input(s): DDIMER in the last 72 hours. Hgb A1c No results for input(s): HGBA1C in the last 72 hours. Lipid Profile No results for input(s): CHOL, HDL, LDLCALC, TRIG, CHOLHDL, LDLDIRECT in the last 72 hours. Thyroid function studies No results for input(s): TSH, T4TOTAL, T3FREE, THYROIDAB in the last  72 hours.  Invalid input(s): FREET3 Anemia work up No results for input(s): VITAMINB12, FOLATE, FERRITIN, TIBC, IRON, RETICCTPCT in the last 72 hours. Urinalysis    Component Value Date/Time   COLORURINE YELLOW 08/03/2016 1700   APPEARANCEUR CLEAR 08/03/2016 1700   LABSPEC 1.010 08/03/2016 1700   PHURINE 6.0 08/03/2016 1700   GLUCOSEU NEGATIVE 08/03/2016 1700   HGBUR LARGE (A) 08/03/2016 1700   BILIRUBINUR SMALL (A) 08/03/2016 1700   KETONESUR 15 (A) 08/03/2016 1700   PROTEINUR 100 (A) 08/03/2016 1700   UROBILINOGEN 0.2 11/14/2012 2155   NITRITE NEGATIVE 08/03/2016 1700   LEUKOCYTESUR NEGATIVE 08/03/2016 1700   Sepsis Labs Invalid input(s): PROCALCITONIN,  WBC,  LACTICIDVEN Microbiology Recent Results (from the past 240 hour(s))  Blood Culture (routine x 2)     Status: None (Preliminary result)   Collection Time: 08/03/16  4:01 PM  Result Value Ref Range Status   Specimen Description BLOOD LEFT ANTECUBITAL  Final   Special Requests BOTTLES DRAWN AEROBIC AND ANAEROBIC 8CC  Final   Culture NO GROWTH 3 DAYS  Final   Report Status PENDING  Incomplete  Blood Culture (routine x 2)     Status: None (Preliminary result)   Collection Time: 08/03/16  4:05 PM  Result Value Ref Range Status   Specimen Description BLOOD RIGHT ANTECUBITAL  Final   Special Requests   Final    BOTTLES DRAWN AEROBIC AND ANAEROBIC AEB=7CC ANA=5CC   Culture NO GROWTH 3 DAYS  Final   Report Status PENDING  Incomplete  Urine culture     Status: None   Collection Time: 08/03/16  5:00 PM  Result Value Ref Range Status   Specimen Description URINE, CATHETERIZED  Final   Special Requests NONE  Final   Culture NO GROWTH Performed at [facility]   Final   Report Status 08/05/2016 FINAL  Final  Culture, respiratory (NON-Expectorated)     Status: None   Collection Time: 08/03/16 11:32 PM  Result Value Ref Range Status   Specimen Description TRACHEAL ASPIRATE  Final   Special Requests Normal  Final    Gram Stain   Final    FEW WBC PRESENT, PREDOMINANTLY PMN RARE GRAM POSITIVE COCCI IN PAIRS RARE YEAST    Culture   Final    Consistent with normal respiratory flora. Performed at [facility]    Report Status 08/06/2016 FINAL  Final  MRSA PCR Screening     Status: None   Collection Time: 08/04/16 12:34 AM  Result Value Ref Range Status   MRSA by PCR NEGATIVE NEGATIVE Final    Comment:        The GeneXpert MRSA Assay (FDA approved for NASAL specimens only), is one component of a comprehensive MRSA colonization surveillance program. It is not intended to diagnose MRSA infection nor to  guide or monitor treatment for MRSA infections.   Respiratory Panel by PCR     Status: None   Collection Time: 08/04/16 12:34 AM  Result Value Ref Range Status   Adenovirus NOT DETECTED NOT DETECTED Final   Coronavirus 229E NOT DETECTED NOT DETECTED Final   Coronavirus HKU1 NOT DETECTED NOT DETECTED Final   Coronavirus NL63 NOT DETECTED NOT DETECTED Final   Coronavirus OC43 NOT DETECTED NOT DETECTED Final   Metapneumovirus NOT DETECTED NOT DETECTED Final   Rhinovirus / Enterovirus NOT DETECTED NOT DETECTED Final   Influenza A NOT DETECTED NOT DETECTED Final   Influenza B NOT DETECTED NOT DETECTED Final   Parainfluenza Virus 1 NOT DETECTED NOT DETECTED Final   Parainfluenza Virus 2 NOT DETECTED NOT DETECTED Final   Parainfluenza Virus 3 NOT DETECTED NOT DETECTED Final   Parainfluenza Virus 4 NOT DETECTED NOT DETECTED Final   Respiratory Syncytial Virus NOT DETECTED NOT DETECTED Final   Bordetella pertussis NOT DETECTED NOT DETECTED Final   Chlamydophila pneumoniae NOT DETECTED NOT DETECTED Final   Mycoplasma pneumoniae NOT DETECTED NOT DETECTED Final    Comment: Performed at [facility]     Time coordinating discharge: Over 30 minutes  SIGNED:   Calvert Cantor, MD  [facility] 08/10/2016, 11:07 AM Pager   If 7PM-7AM, please contact  night-coverage [email or web address] Password [email or web address]

## 2016-08-10 NOTE — Progress Notes (Signed)
Called patient's niece, Lowanda FosterBrittany, to let her know that her Celine Ahrunt was being picked up for transport to SNF.

## 2016-08-10 NOTE — Progress Notes (Signed)
Gave report to RN at [facility] [place]. Left number in case she had additional questions.

## 2016-08-10 NOTE — NC FL2 (Signed)
[place] LEVEL OF CARE SCREENING TOOL     IDENTIFICATION  Patient Name: [name] Birthdate: 1955/09/17 Sex: female Admission Date (Current Location): 08/03/2016  [facility] and [place] Number:  Herbalist and Address:  [facility],  [place] [address] [place]      Provider Number: [id number]  Attending Physician Name and Address:  Debbe Odea, MD  Relative Name and Phone Number:       Current Level of Care: Hospital Recommended Level of Care: [place] Prior Approval Number:    Date Approved/Denied:   PASRR Number:   [id number]   Discharge Plan: Hospital    Current Diagnoses: Patient Active Problem List   Diagnosis Date Noted  . Tobacco use disorder 08/07/2016  . Pulmonary embolus ([place])   . Protein-calorie malnutrition, severe ([place]) 08/04/2016  . Acute respiratory failure with hypoxia ([place]) 08/03/2016  . Acute encephalopathy 08/03/2016  . Community acquired pneumonia 08/03/2016  . Sepsis ([place]) 08/03/2016  . Bradycardia 11/19/2012  . IBS (irritable bowel syndrome) 11/19/2012  . UTI (urinary tract infection) 11/17/2012  . Anemia 11/17/2012  . Hypothyroidism (acquired) 11/16/2012  . Non-ST elevation MI (NSTEMI) ([place]) 11/16/2012  . Non-sustained ventricular tachycardia ([place]) 11/16/2012  . Hyperglycemia 11/16/2012  . Hypokalemia 11/15/2012  . Colitis 11/15/2012  . Abdominal pain 11/15/2012  . Dehydration 11/15/2012  . Encephalopathy, metabolic 38/25/0539  . Thyroiditis, chronic, lymphocytic 11/15/2012  . Enteritis 11/15/2012  . Bladder wall thickening 11/15/2012  . Chronic pain syndrome 11/15/2012  . Hematuria, microscopic 11/15/2012    Orientation RESPIRATION BLADDER Height & Weight     Self  O2 (1L) Indwelling catheter Weight: 93 lb 14.7 oz (42.6 kg) Height:  '5\' 2"'$  (157.5 cm)  BEHAVIORAL SYMPTOMS/MOOD NEUROLOGICAL BOWEL NUTRITION STATUS      Incontinent Diet  AMBULATORY STATUS  COMMUNICATION OF NEEDS Skin   Total Care Verbally Normal                       Personal Care Assistance Level of Assistance  Bathing, Feeding, Dressing Bathing Assistance: Maximum assistance Feeding assistance: Limited assistance Dressing Assistance: Maximum assistance     Functional Limitations Info  Sight, Hearing, Speech Sight Info: Adequate Hearing Info: Adequate Speech Info: Adequate    SPECIAL CARE FACTORS FREQUENCY  PT (By licensed PT), OT (By licensed OT)     PT Frequency: 5 OT Frequency: 5            Contractures Contractures Info: Not present    Additional Factors Info  Code Status, Allergies Code Status Info: DNR Allergies Info: Aspirin, Calcium-containing Compounds, Codeine, Tagamet Cimetidine           Current Medications (08/10/2016):  This is the current hospital active medication list Current Facility-Administered Medications  Medication Dose Route Frequency Provider Last Rate Last Dose  . acetaminophen (TYLENOL) solution 650 mg  650 mg Oral Q4H PRN Javier Glazier, MD   650 mg at 08/06/16 2338  . cefTRIAXone (ROCEPHIN) 1 g in dextrose 5 % 50 mL IVPB  1 g Intravenous Q24H Anh P Pham, RPH   1 g at 08/09/16 1549  . chlorhexidine gluconate (MEDLINE KIT) (PERIDEX) 0.12 % solution 15 mL  15 mL Mouth Rinse BID Javier Glazier, MD   15 mL at 08/09/16 2000  . clopidogrel (PLAVIX) tablet 75 mg  75 mg Oral Daily Erick Colace, NP   75 mg at 08/09/16 1007  . enoxaparin (LOVENOX) injection  40 mg  40 mg Subcutaneous Q12H Emiliano Dyer, RPH   40 mg at 08/10/16 1165  . feeding supplement (ENSURE ENLIVE) (ENSURE ENLIVE) liquid 237 mL  237 mL Oral BID BM Debbe Odea, MD   237 mL at 08/08/16 1403  . heparin lock flush 100 unit/mL  500 Units Intracatheter PRN Javier Glazier, MD   500 Units at 08/04/16 2020  . hydrALAZINE (APRESOLINE) injection 10-40 mg  10-40 mg Intravenous Q4H PRN Brand Males, MD   40 mg at 08/08/16 1832  . ipratropium-albuterol  (DUONEB) 0.5-2.5 (3) MG/3ML nebulizer solution 3 mL  3 mL Nebulization TID Debbe Odea, MD      . levothyroxine (SYNTHROID, LEVOTHROID) injection 1,100 mcg  1,100 mcg Intravenous Weekly Saima Rizwan, MD      . metoprolol tartrate (LOPRESSOR) 25 mg/10 mL oral suspension 50 mg  50 mg Oral BID Erick Colace, NP   50 mg at 08/09/16 2219  . pantoprazole (PROTONIX) EC tablet 40 mg  40 mg Oral Q1200 Erick Colace, NP   40 mg at 08/09/16 1245     Discharge Medications: Please see discharge summary for a list of discharge medications.  Relevant Imaging Results:  Relevant Lab Results:   Additional Information SS#[id number]  Lia Hopping, LCSW

## 2016-08-10 NOTE — Care Management Note (Signed)
Case Management Note  Patient Details  Name: Olivia StaggersMartha A XXXDixon MRN: [id number] Date of Birth: [birth year]  Subjective/Objective:                    Action/Plan:d/c SNF.   Expected Discharge Date:                  Expected Discharge Plan:  Skilled Nursing Facility  In-House Referral:  Clinical Social Work  Discharge planning Services     Post Acute Care Choice:    Choice offered to:     DME Arranged:    DME Agency:     HH Arranged:    HH Agency:     Status of Service:  Completed, signed off  If discussed at [employer] Meetings, dates discussed:    Additional Comments:  Lanier ClamMahabir, Marishka Rentfrow, RN 08/10/2016, 12:27 PM

## 2016-08-11 LAB — CULTURE, BLOOD (ROUTINE X 2)
CULTURE: NO GROWTH
CULTURE: NO GROWTH

## 2016-09-06 DEATH — deceased

## 2018-01-17 IMAGING — CR DG CHEST 1V
1 series · 1 of 1 positions shown · non-contrast
Comparison: 08/03/2016

CLINICAL DATA: Post intubation

EXAM:
CHEST 1 VIEW

[ap]
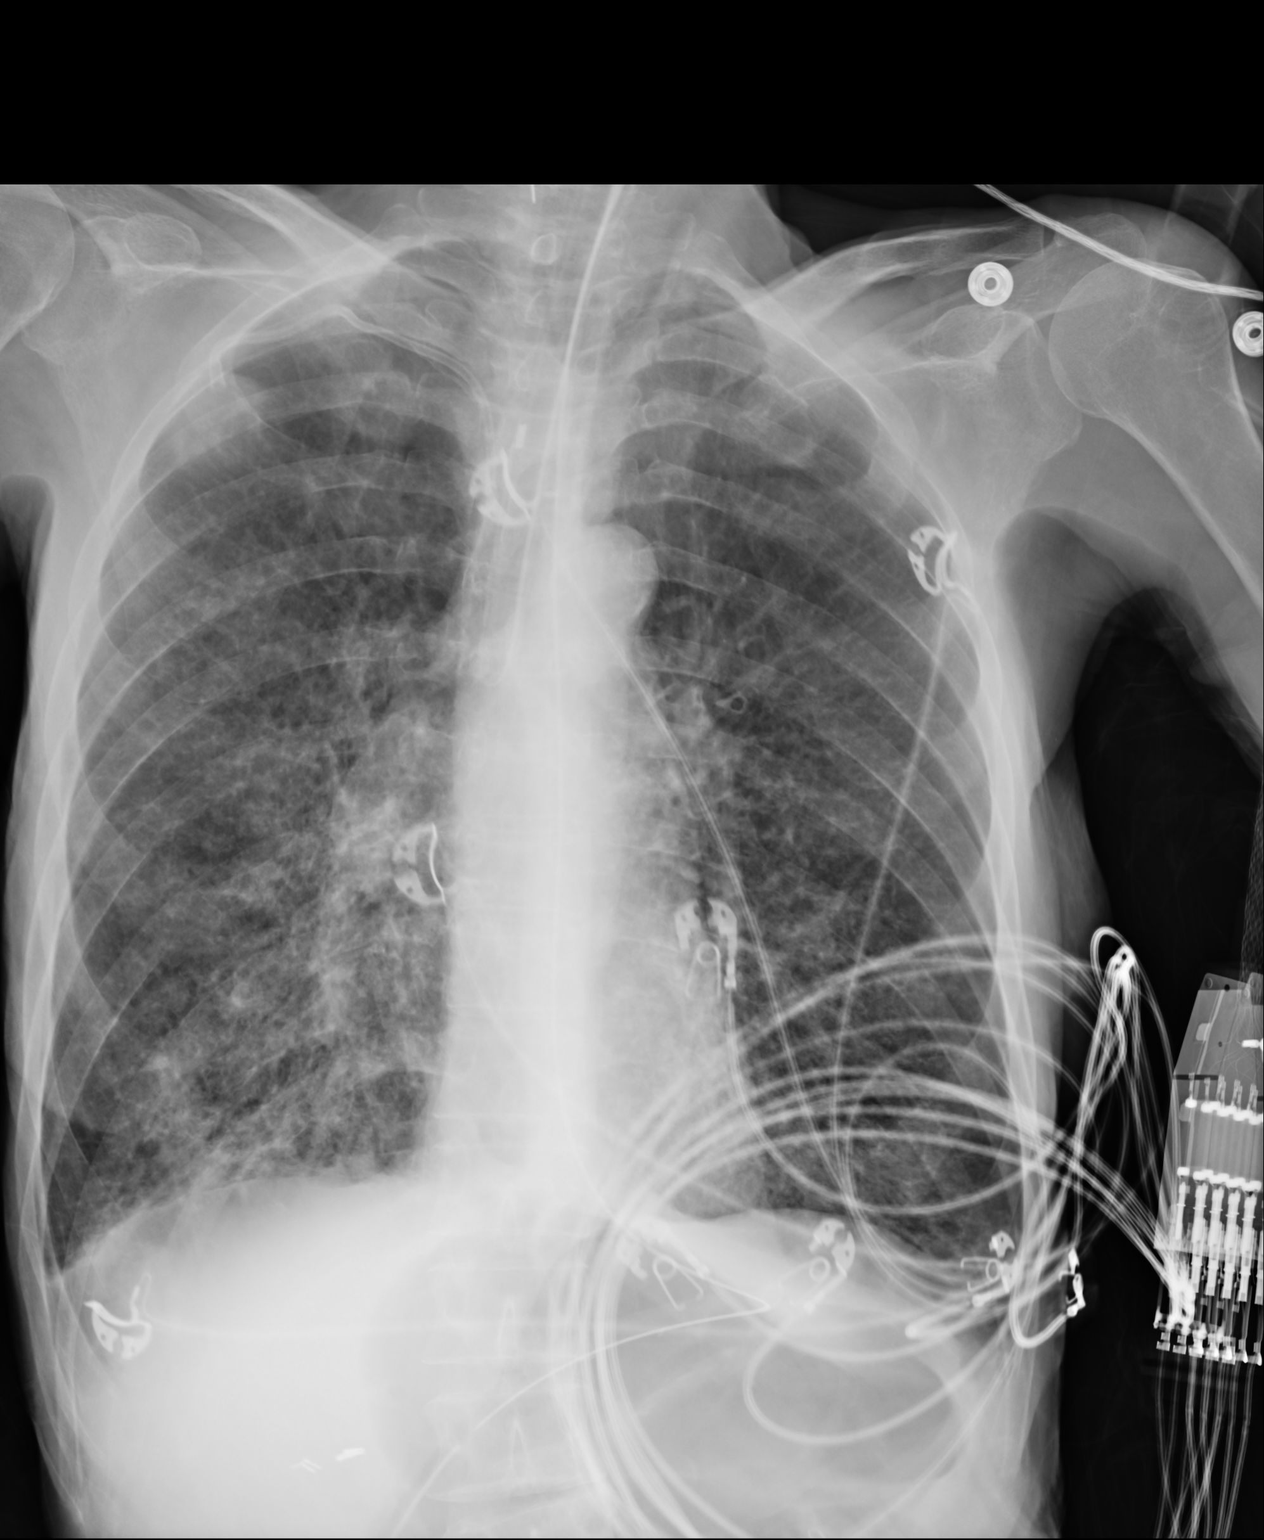

[1 of 1 positions shown; findings below may reference images not displayed]

FINDINGS: Endotracheal tube just below the carina in the right main bronchus.
Recommend withdrawal 4 cm.

Port-A-Cath tip in the SVC

Chronic lung disease. Diffuse bilateral airspace disease which has
progressed on the right. Probable pneumonia. No significant
effusion.
IMPRESSION: Endotracheal tube in right main bronchus.  Recommend withdrawal 4 cm

Progression of infiltrate on the right most likely due to pneumonia.
Diffuse bilateral airspace disease.

These results will be called to the ordering clinician or
representative by the Radiologist Assistant, and communication
documented in the PACS or zVision Dashboard.

## 2018-01-17 IMAGING — CR DG CHEST 1V PORT
1 series · 1 of 1 positions shown · non-contrast
Comparison: 11/16/2012

CLINICAL DATA: Altered mental status.  Hypoxia.

EXAM:
PORTABLE CHEST 1 VIEW

[portable]
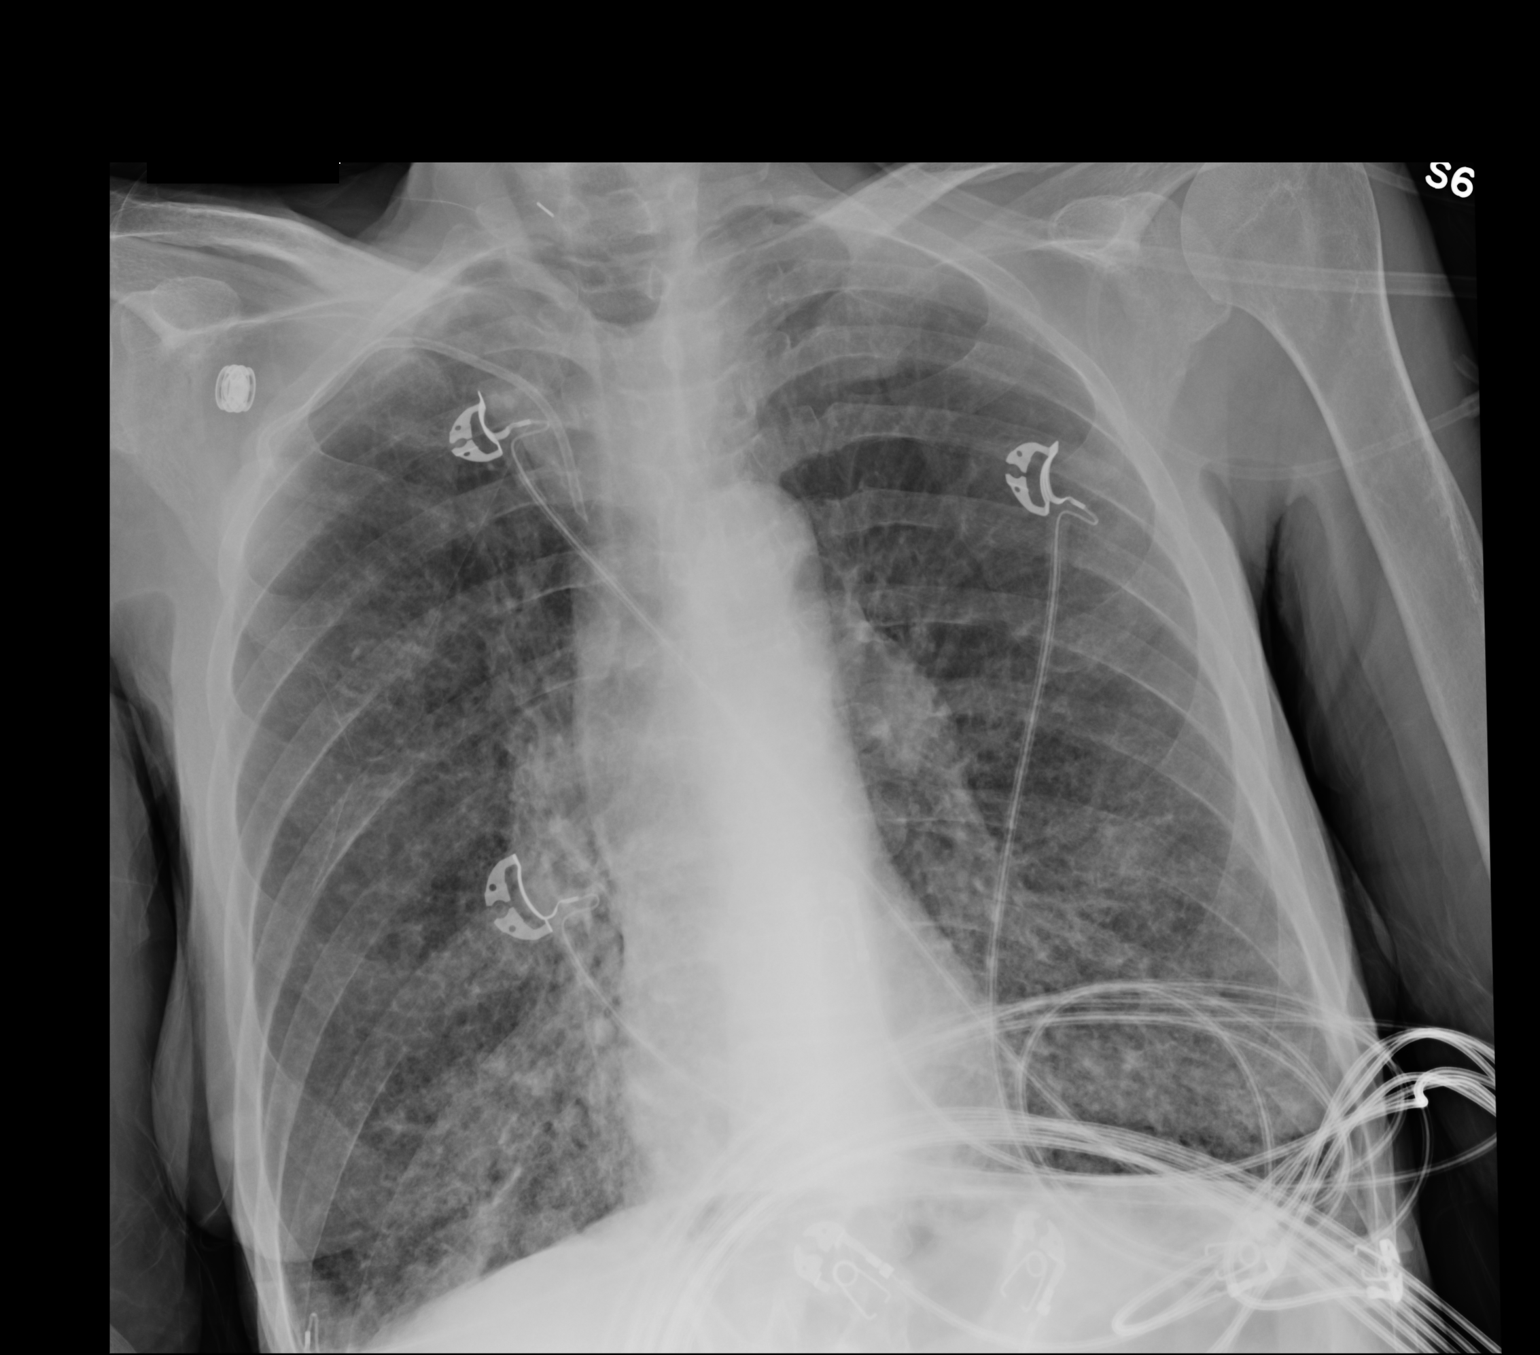

[1 of 1 positions shown; findings below may reference images not displayed]

FINDINGS: COPD with pulmonary hyperinflation. Diffuse bilateral airspace
disease which may be due to pneumonia or edema. This was not present
previously. No effusion. Heart size normal.

Port-A-Cath tip in the proximal SVC unchanged.
IMPRESSION: COPD with diffuse bilateral airspace disease. Diffuse pneumonia
versus heart failure however the heart is not enlarged.

## 2018-01-17 IMAGING — DX DG CHEST 1V PORT
1 series · 2 of 2 positions shown · non-contrast
Comparison: Chest radiograph performed earlier today at [DATE] p.m.

CLINICAL DATA: Endotracheal tube repositioning.  Initial encounter.

EXAM:
PORTABLE CHEST 1 VIEW

[Series 1: chest ap · 0.14mm/px · 2 of 2 slices shown]
[im 1/2]
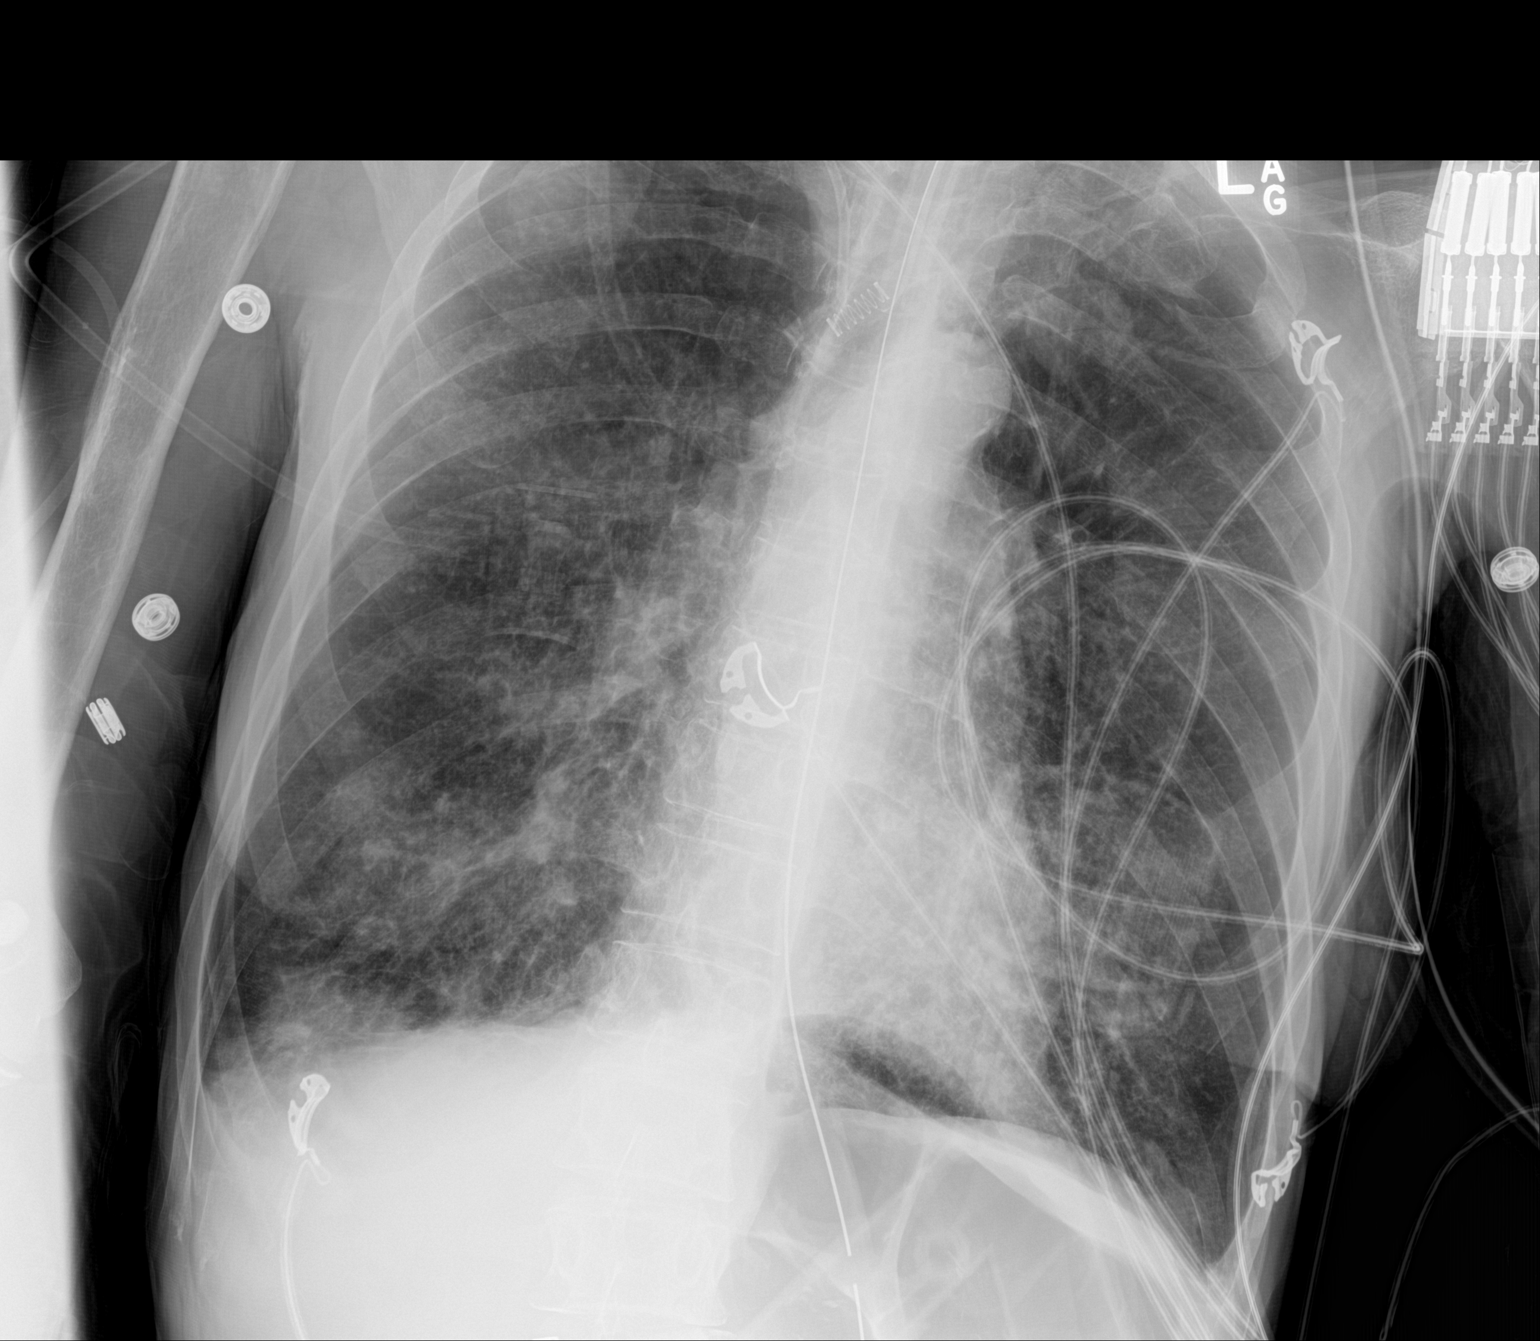
[im 2/2]
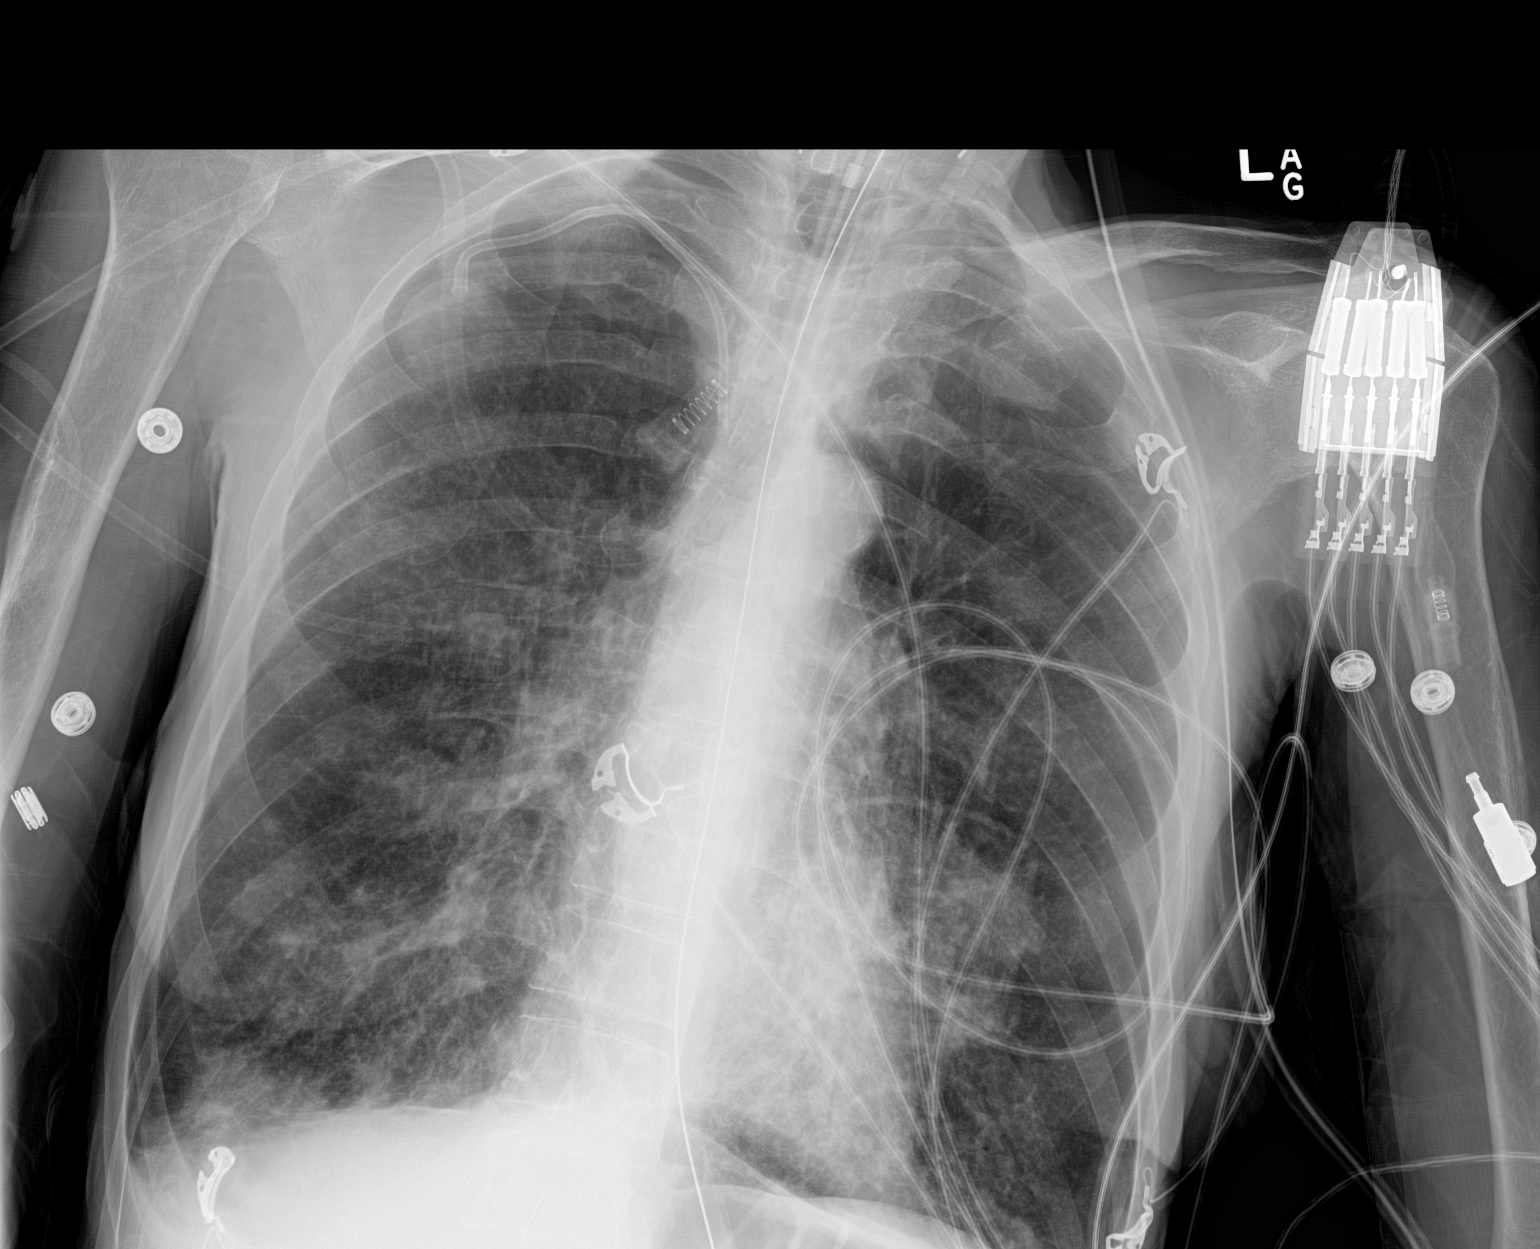

[2 of 2 positions shown; findings below may reference images not displayed]

FINDINGS: The patient's endotracheal tube is seen ending 4-5 cm above the
carina. An enteric tube is noted extending below the diaphragm. A
right-sided chest port is noted ending about the proximal to mid
SVC.

The lungs are well-aerated. Patchy bilateral airspace opacification
remains concerning for pneumonia, similar in appearance to the prior
study. No pleural effusion or pneumothorax is seen.

The heart remains normal in size. No acute osseous abnormalities are
identified.
IMPRESSION: 1. Endotracheal tube seen ending 4-5 cm above the carina.
2. Patchy bilateral airspace opacification remains concerning for
pneumonia, similar in appearance to the prior study.
